# Patient Record
Sex: Female | Born: 1995 | Race: White | Hispanic: No | Marital: Single | Smoking: Never smoker
Health system: Southern US, Community
[De-identification: ages and names within clinical notes are randomized; demographics above are authoritative.]

---
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
## 2017-03-02 ENCOUNTER — Other Ambulatory Visit: Payer: Self-pay | Admitting: Sports Medicine

## 2017-04-09 ENCOUNTER — Ambulatory Visit (INDEPENDENT_AMBULATORY_CARE_PROVIDER_SITE_OTHER): Payer: 59 | Admitting: Sports Medicine

## 2017-04-09 ENCOUNTER — Encounter: Payer: Self-pay | Admitting: Sports Medicine

## 2017-04-09 DIAGNOSIS — G43109 Migraine with aura, not intractable, without status migrainosus: Secondary | ICD-10-CM | POA: Diagnosis not present

## 2017-04-09 DIAGNOSIS — Z0000 Encounter for general adult medical examination without abnormal findings: Secondary | ICD-10-CM

## 2017-04-09 DIAGNOSIS — F329 Major depressive disorder, single episode, unspecified: Secondary | ICD-10-CM

## 2017-04-09 MED ORDER — RIZATRIPTAN BENZOATE 10 MG PO TABS: ORAL_TABLET | ORAL | 11 refills | Status: DC

## 2017-04-09 NOTE — Progress Notes (Signed)
  Subjective:    CC: Annual physical   HPI:  Patricia Best is here for a physical before she goes to [employer] in [place].  She is doing well, has no complaints, she will be studying music.  Anxiety and depression: Patient is self discontinued all of her medications, overall she feels good.  Migraine headaches: Has stopped her Topamax, has only had a seen a migraine that lasted about 4 hours, agreeable to keep some of the abortive medications with her.  Past medical history:  Negative.  See flowsheet/record as well for more information.  Surgical history: Negative.  See flowsheet/record as well for more information.  Family history: Negative.  See flowsheet/record as well for more information.  Social history: Negative.  See flowsheet/record as well for more information.  Allergies, and medications have been entered into the medical record, reviewed, and no changes needed.    Review of Systems: No headache, visual changes, nausea, vomiting, diarrhea, constipation, dizziness, abdominal pain, skin rash, fevers, chills, night sweats, swollen lymph nodes, weight loss, chest pain, body aches, joint swelling, muscle aches, shortness of breath, mood changes, visual or auditory hallucinations.  Objective:    General: Well Developed, well nourished, and in no acute distress.  Neuro: Alert and oriented x3, extra-ocular muscles intact, sensation grossly intact. Cranial nerves II through XII are intact, motor, sensory, and coordinative functions are all intact. HEENT: Normocephalic, atraumatic, pupils equal round reactive to light, neck supple, no masses, no lymphadenopathy, thyroid nonpalpable. Oropharynx, nasopharynx, external ear canals are unremarkable. Skin: Warm and dry, no rashes noted.  Cardiac: Regular rate and rhythm, no murmurs rubs or gallops.  Respiratory: Clear to auscultation bilaterally. Not using accessory muscles, speaking in full sentences.  Abdominal: Soft, nontender,  nondistended, positive bowel sounds, no masses, no organomegaly.  Musculoskeletal: Shoulder, elbow, wrist, hip, knee, ankle stable, and with full range of motion.  Impression and Recommendations:    The patient was counselled, risk factors were discussed, anticipatory guidance given.  Annual physical exam Annual physical as above, former filled out for school. She is up-to-date on vaccinations. Checking routine blood work.  Depression Patient has weaned herself off of all medications and continues to do okay. Certainly if she starts to develop some depressive symptoms in college they have psychologists there and I would be happy to restart Trintellix.  Migraine headache with aura Refilling Maxalt, no longer taking preventative medications.

## 2017-04-09 NOTE — Assessment & Plan Note (Signed)
Annual physical as above, former filled out for school. She is up-to-date on vaccinations. Checking routine blood work.

## 2017-04-09 NOTE — Assessment & Plan Note (Signed)
Refilling Maxalt, no longer taking preventative medications.

## 2017-04-09 NOTE — Assessment & Plan Note (Addendum)
Patient has weaned herself off of all medications and continues to do okay. Certainly if she starts to develop some depressive symptoms in college they have psychologists there and I would be happy to restart Trintellix.

## 2017-04-10 LAB — VITAMIN D 25 HYDROXY (VIT D DEFICIENCY, FRACTURES): Vit D, 25-Hydroxy: 26 ng/mL — ABNORMAL LOW (ref 30–100)

## 2017-04-10 LAB — COMPREHENSIVE METABOLIC PANEL WITH GFR
Albumin: 4.4 g/dL (ref 3.6–5.1)
Alkaline Phosphatase: 67 U/L (ref 47–176)
BUN: 12 mg/dL (ref 7–20)
Creat: 0.85 mg/dL (ref 0.50–1.00)
Sodium: 140 mmol/L (ref 135–146)
Total Protein: 7.3 g/dL (ref 6.3–8.2)

## 2017-04-10 LAB — COMPREHENSIVE METABOLIC PANEL
ALT: 10 U/L (ref 5–32)
AST: 17 U/L (ref 12–32)
CO2: 27 mmol/L (ref 20–31)
Calcium: 9.4 mg/dL (ref 8.9–10.4)
Chloride: 102 mmol/L (ref 98–110)
Glucose, Bld: 74 mg/dL (ref 65–99)
Potassium: 4.4 mmol/L (ref 3.8–5.1)
Total Bilirubin: 0.3 mg/dL (ref 0.2–1.1)

## 2017-04-10 LAB — LIPID PANEL W/REFLEX DIRECT LDL
Cholesterol: 164 mg/dL (ref <170)
HDL: 51 mg/dL (ref >45)
LDL-Cholesterol: 88 mg/dL (ref <110)
Non-HDL Cholesterol (Calc): 113 mg/dL (ref <120)
Total CHOL/HDL Ratio: 3.2 Ratio (ref <5.0)
Triglycerides: 153 mg/dL — ABNORMAL HIGH (ref <90)

## 2017-04-10 LAB — HEMOGLOBIN A1C
Hgb A1c MFr Bld: 5.3 % (ref <5.7)
Mean Plasma Glucose: 105 mg/dL

## 2017-04-10 LAB — CBC
HCT: 39.1 % (ref 34.0–46.0)
Hemoglobin: 12.5 g/dL (ref 11.5–15.3)
MCH: 26.5 pg (ref 25.0–35.0)
MCHC: 32 g/dL (ref 31.0–36.0)
MCV: 83 fL (ref 78.0–98.0)
MPV: 9.3 fL (ref 7.5–12.5)
Platelets: 338 K/uL (ref 140–400)
RBC: 4.71 MIL/uL (ref 3.80–5.10)
RDW: 14.7 % (ref 11.0–15.0)
WBC: 7.5 10*3/uL (ref 4.5–13.0)

## 2017-04-10 LAB — TSH: TSH: 1.45 mIU/L (ref 0.50–4.30)

## 2017-04-10 MED ORDER — VITAMIN D (ERGOCALCIFEROL) 1.25 MG (50000 UNIT) PO CAPS: 50000.0000 [IU] | ORAL_CAPSULE | ORAL | 0 refills | Status: DC

## 2017-04-10 NOTE — Addendum Note (Signed)
Addended by: Monica BectonHEKKEKANDAM, THOMAS J on: 04/10/2017 08:51 AM   Modules accepted: Orders

## 2017-04-11 LAB — QUANTIFERON TB GOLD ASSAY (BLOOD)
Interferon Gamma Release Assay: NEGATIVE
Mitogen-Nil: 9.36 IU/mL
Quantiferon Nil Value: 0.04 [IU]/mL
Quantiferon Tb Ag Minus Nil Value: 0.01 IU/mL

## 2017-04-30 ENCOUNTER — Encounter: Payer: Self-pay | Admitting: Sports Medicine

## 2017-06-04 ENCOUNTER — Ambulatory Visit: Payer: Self-pay

## 2017-06-20 ENCOUNTER — Other Ambulatory Visit: Payer: Self-pay | Admitting: Sports Medicine

## 2017-07-26 IMAGING — CR DG CHEST 2V
2 series · 2 of 2 positions shown · non-contrast
Comparison: No prior.

CLINICAL DATA: Coughing.

EXAM:
CHEST  2 VIEW

[chest pa]
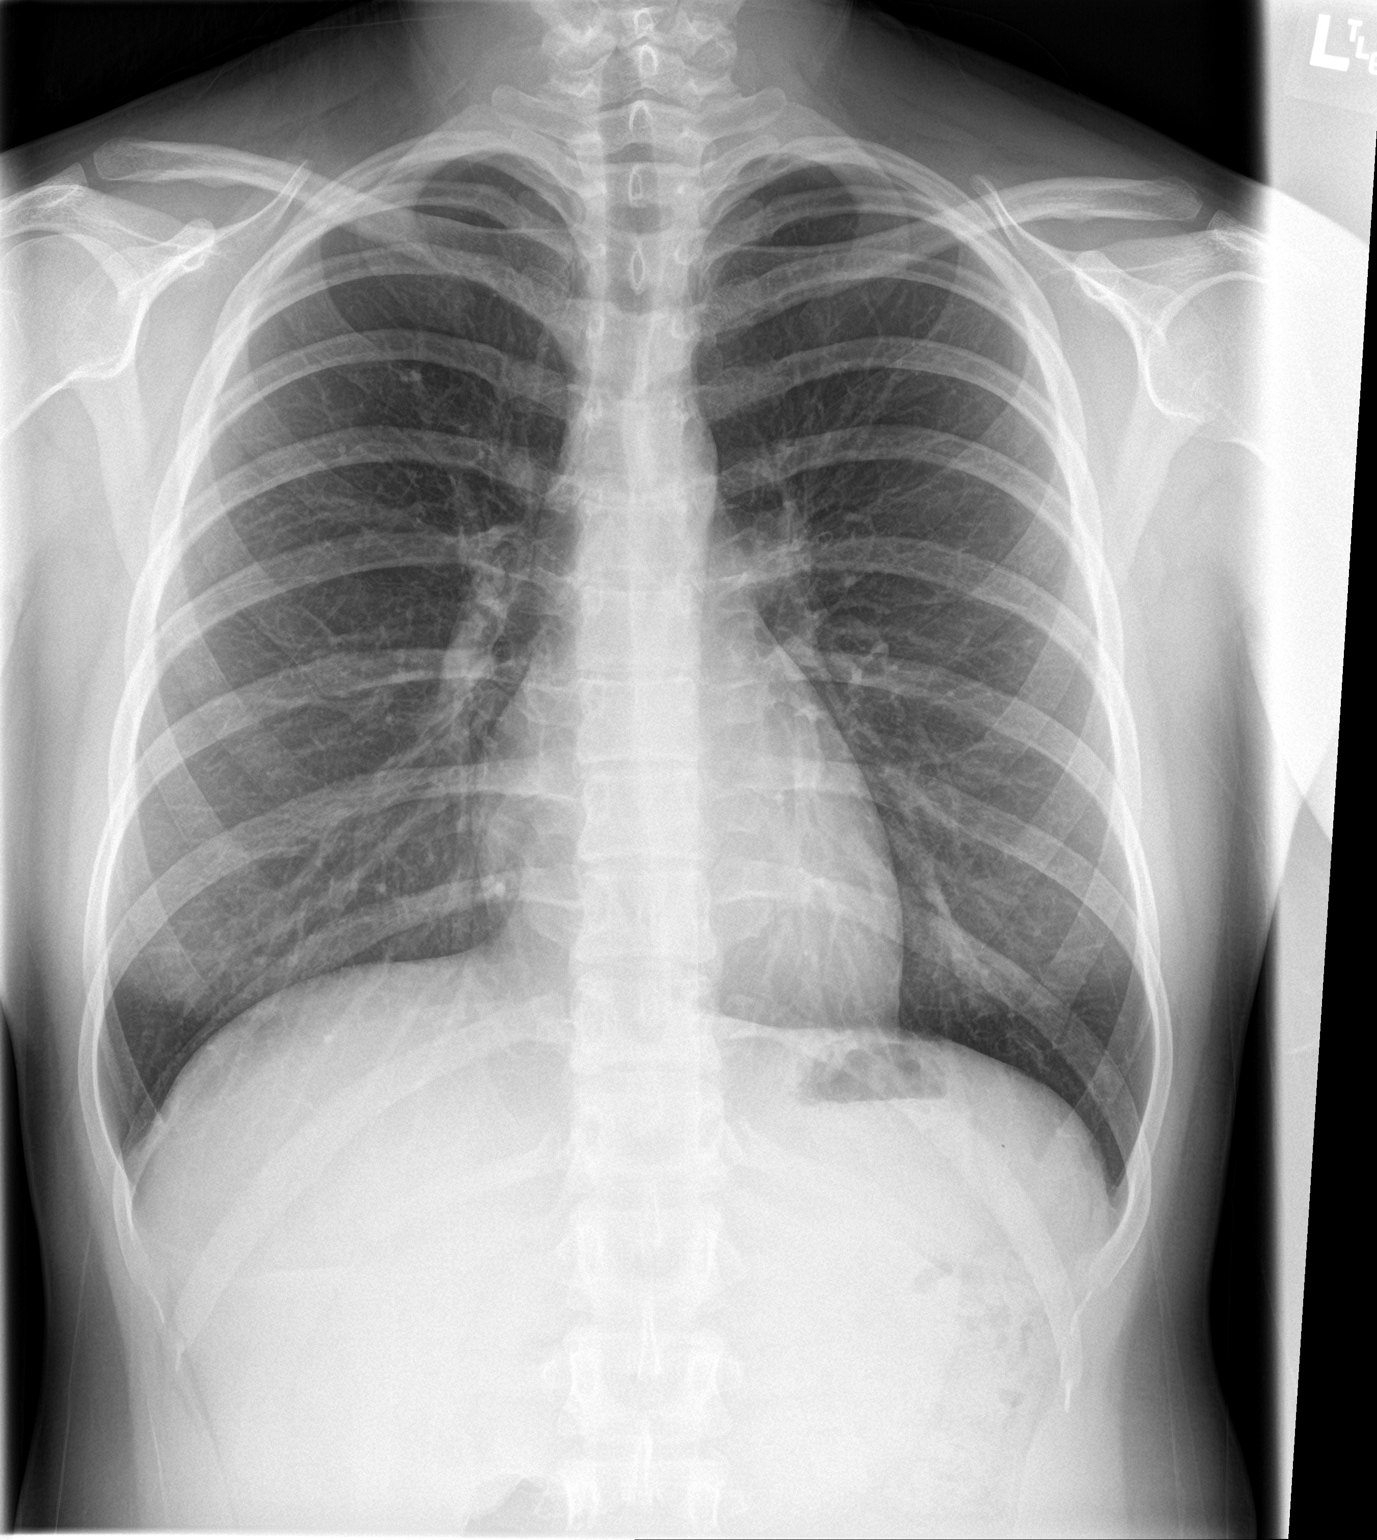

[chest lat]
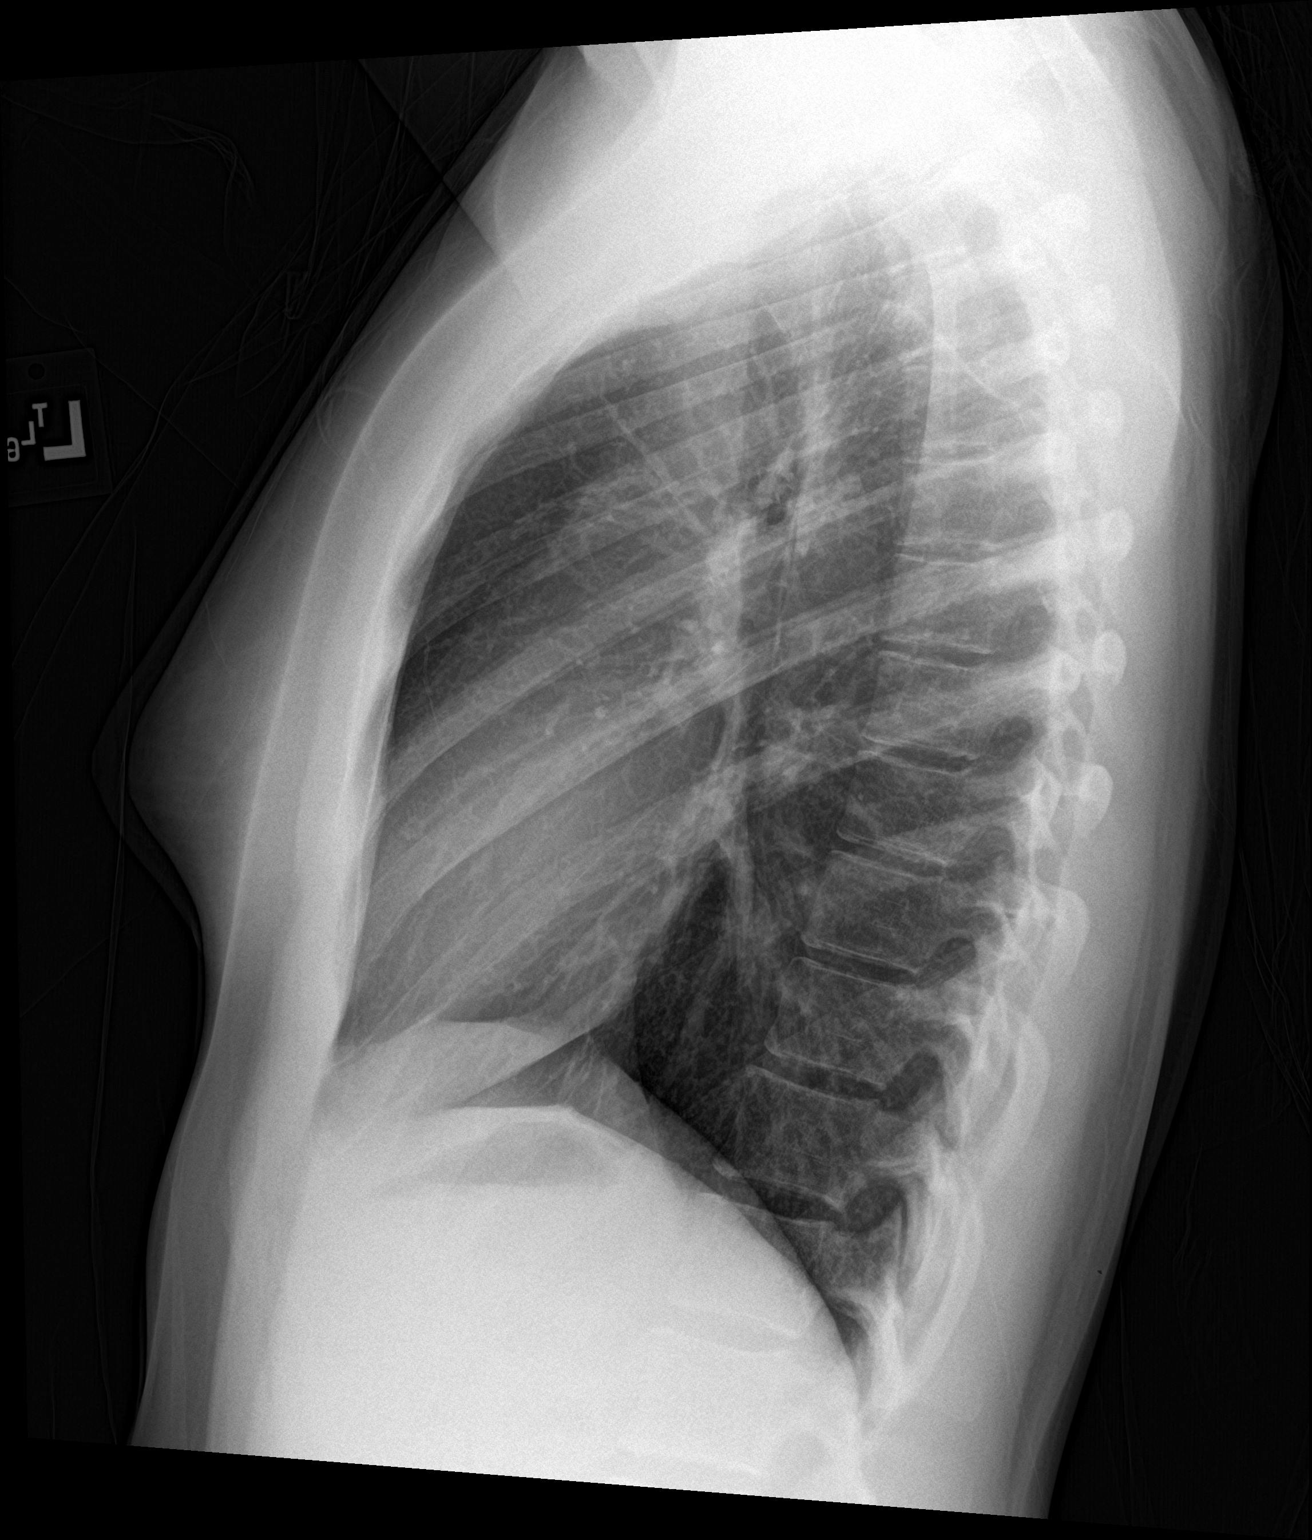

[2 of 2 positions shown; findings below may reference images not displayed]

FINDINGS: Mediastinum and hilar structures normal. Lungs are clear. Heart size
normal. No pleural effusion or pneumothorax .
IMPRESSION: No acute cardiopulmonary disease.

## 2017-09-25 ENCOUNTER — Ambulatory Visit: Payer: 59 | Admitting: Family Medicine

## 2017-09-25 ENCOUNTER — Ambulatory Visit: Payer: Self-pay | Admitting: Physician Assistant

## 2017-09-25 ENCOUNTER — Encounter: Payer: Self-pay | Admitting: Family Medicine

## 2017-09-25 VITALS — BP 137/74 | HR 83 | Temp 98.0°F | Resp 16 | Ht 62.0 in | Wt 155.1 lb

## 2017-09-25 DIAGNOSIS — L858 Other specified epidermal thickening: Secondary | ICD-10-CM | POA: Diagnosis not present

## 2017-09-25 DIAGNOSIS — L219 Seborrheic dermatitis, unspecified: Secondary | ICD-10-CM

## 2017-09-25 DIAGNOSIS — L719 Rosacea, unspecified: Secondary | ICD-10-CM

## 2017-09-25 MED ORDER — TRIAMCINOLONE ACETONIDE 0.1 % EX CREA: 1.0000 "application " | TOPICAL_CREAM | Freq: Two times a day (BID) | CUTANEOUS | 12 refills | Status: DC

## 2017-09-25 MED ORDER — KETOCONAZOLE 2 % EX CREA: 1.0000 "application " | TOPICAL_CREAM | Freq: Two times a day (BID) | CUTANEOUS | 12 refills | Status: DC

## 2017-09-25 MED ORDER — METRONIDAZOLE 1 % EX GEL: Freq: Every day | CUTANEOUS | 12 refills | Status: DC

## 2017-09-25 NOTE — Patient Instructions (Signed)
Thank you for coming in today. For eyelid rash I think this is Seborrheic Dermatitis.  Apply ketoconazole twice daily until it goes away.  You can also use Triamolone cream occasionally on it for bad days.   For Face rash I think this is rosacea.  Apply metrogel daily until it goes away.   For arm rash when bad use Triamolone cream daily or twice daily  Seborrheic Dermatitis, Adult Seborrheic dermatitis is a skin disease that causes red, scaly patches. It usually occurs on the scalp, and it is often called dandruff. The patches may appear on other parts of the body. Skin patches tend to appear where there are many oil glands in the skin. Areas of the body that are commonly affected include:  Scalp.  Skin folds of the body.  Ears.  Eyebrows.  Neck.  Face.  Armpits.  The bearded area of men's faces.  The condition may come and go for no known reason, and it is often long-lasting (chronic). What are the causes? The cause of this condition is not known. What increases the risk? This condition is more likely to develop in people who:  Have certain conditions, such as: ? HIV (human immunodeficiency virus). ? AIDS (acquired immunodeficiency syndrome). ? Parkinson disease. ? Mood disorders, such as depression.  Are 19 years old.  What are the signs or symptoms? Symptoms of this condition include:  Thick scales on the scalp.  Redness on the face or in the armpits.  Skin that is flaky. The flakes may be white or yellow.  Skin that seems oily or dry but is not helped with moisturizers.  Itching or burning in the affected areas.  How is this diagnosed? This condition is diagnosed with a medical history and physical exam. A sample of your skin may be tested (skin biopsy). You may need to see a skin specialist (dermatologist). How is this treated? There is no cure for this condition, but treatment can help to manage the symptoms. You may get treatment to remove scales,  lower the risk of skin infection, and reduce swelling or itching. Treatment may include:  Creams that reduce swelling and irritation (steroids).  Creams that reduce skin yeast.  Medicated shampoo, soaps, moisturizing creams, or ointments.  Medicated moisturizing creams or ointments.  Follow these instructions at home:  Apply over-the-counter and prescription medicines only as told by your health care provider.  Use any medicated shampoo, soaps, skin creams, or ointments only as told by your health care provider.  Keep all follow-up visits as told by your health care provider. This is important. Contact a health care provider if:  Your symptoms do not improve with treatment.  Your symptoms get worse.  You have new symptoms. This information is not intended to replace advice given to you by your health care provider. Make sure you discuss any questions you have with your health care provider. Document Released: 10/22/2005 Document Revised: 05/11/2016 Document Reviewed: 02/09/2016 Elsevier Interactive Patient Education  2018 [employer]    Rosacea Rosacea is a long-term (chronic) condition that affects the skin of the face, including the cheeks, nose, brow, and chin. This condition can also affect the eyes. Rosacea causes blood vessels near the surface of the skin to enlarge, which results in redness. What are the causes? The cause of this condition is not known. Certain triggers can make rosacea worse, including:  Hot baths.  Exercise.  Sunlight.  Very hot or cold temperatures.  Hot or spicy foods and drinks.  Drinking alcohol.  Stress.  Taking blood pressure medicine.  Long-term use of topical steroids on the face.  What increases the risk? This condition is more likely to develop in:  People who are older than 19 years of age.  Women.  People who have light-colored skin (light complexion).  People who have a family history of rosacea.  What are the  signs or symptoms? Symptoms of this condition include:  Redness of the face.  Red bumps or pimples on the face.  A red, enlarged nose.  Blushing easily.  Red lines on the skin.  Irritated or burning feeling in the eyes.  Swollen eyelids.  How is this diagnosed? This condition is diagnosed with a medical history and physical exam. How is this treated? There is no cure for this condition, but treatment can help to control your symptoms. Your health care provider may recommend that you see a skin specialist (dermatologist). Treatment may include:  Antibiotic medicines that are applied to the skin or taken as a pill.  Laser treatment to improve the appearance of the skin.  Surgery. This is rare.  Your health care provider will also recommend the best way to take care of your skin. Even after your skin improves, you will likely need to continue treatment to prevent your rosacea from coming back. Follow these instructions at home: Skin Care Take care of your skin as told by your health care provider. You may be told to do these things:  Wash your skin gently two or more times each day.  Use mild soap.  Use a sunscreen or sunblock with SPF 30 or greater.  Use gentle cosmetics that are meant for sensitive skin.  Shave with an electric shaver instead of a blade.  Lifestyle  Try to keep track of what foods trigger this condition. Avoid any triggers. These may include: ? Spicy foods. ? Seafood. ? Cheese. ? Hot liquids. ? Nuts. ? Chocolate. ? Iodized salt.  Do not drink alcohol.  Avoid extremely cold or hot temperatures.  Try to reduce your stress. If you need help, talk with your health care provider.  When you exercise, do these things to stay cool: ? Limit your sun exposure. ? Use a fan. ? Do shorter and more frequent intervals of exercise. General instructions  Keep all follow-up visits as told by your health care provider. This is important.  Take  over-the-counter and prescription medicines only as told by your health care provider.  If your eyelids are affected, apply warm compresses to them. Do this as told by your health care provider.  If you were prescribed an antibiotic medicine, apply or take it as told by your health care provider. Do not stop using the antibiotic even if your condition improves. Contact a health care provider if:  Your symptoms get worse.  Your symptoms do not improve after two months of treatment.  You have new symptoms.  You have any changes in vision or you have problems with your eyes, such as redness or itching.  You feel depressed.  You lose your appetite.  You have trouble concentrating. This information is not intended to replace advice given to you by your health care provider. Make sure you discuss any questions you have with your health care provider. Document Released: 11/29/2004 Document Revised: 03/29/2016 Document Reviewed: 12/29/2014 Elsevier Interactive Patient Education  2018 Elsevier Inc.   Keratosis Pilaris, Pediatric Keratosis pilaris is a long-term (chronic) condition that causes tiny, painless skin bumps. The bumps  result when dead skin builds up in the roots of skin hairs (hair follicles). This condition is common among children. It does not spread from person to person (is not contagious) and it does not cause any serious medical problems. The condition usually develops by age 41 and often starts to go away during teenage or young adult years. In other cases, keratosis pilaris may be more likely to flare up during puberty. What are the causes? The exact cause of this condition is not known. It may be passed along from parent to child (inherited). What increases the risk? Your child may have a greater risk of keratosis pilaris if your child:  Has a family history of the condition.  Is a girl.  Swims often in swimming pools.  Has eczema, asthma, or hay fever.  What are  the signs or symptoms? The main symptom of keratosis pilaris is tiny bumps on the skin. The bumps may:  Feel itchy or rough.  Look like goose bumps.  Be the same color as the skin, white, pink, red, or darker than normal skin color.  Come and go.  Get worse during winter.  Cover a small or large area.  Develop on the arms, thighs, and cheeks. They may also appear on other areas of skin. They do not appear on the palms of the hands or soles of the feet.  How is this diagnosed? This condition is diagnosed based on your child's symptoms and medical history and a physical exam. No tests are needed to make a diagnosis. How is this treated? There is no cure for keratosis pilaris. The condition may go away over time. Your child may not need treatment unless the bumps are itchy or widespread or they become infected from scratching. Treatment may include:  Moisturizing cream or lotion.  Skin-softening cream (emollient).  A cream or ointment that reduces inflammation (steroid).  Antibiotic medicine, if a skin infection develops. The antibiotic may be given by mouth (orally) or as a cream.  Follow these instructions at home: Skin Care  Apply skin cream or ointment as told by your child's health care provider. Do not stop using the cream or ointment even if your child's condition improves.  Do not let your child take long, hot, baths or showers. Apply moisturizing creams and lotions after a bath or shower.  Do not use soaps that dry your child's skin. Ask your child's health care provider to recommend a mild soap.  Do not let your child swim in swimming pools if it makes your child's skin condition worse.  Remind your child not to scratch or pick at skin bumps. Tell your child's health care provider if itching is a problem. General instructions   Give your child antibiotic medicine as told by your child's health care provider. Do not stop applying or giving the antibiotic even if  your child's condition improves.  Give your child over-the-counter and prescription medicines only as told by your child's health care provider.  Use a humidifier if the air in your home is dry.  Have your child return to normal activities as told by your child's health care provider. Ask what activities are safe for your child.  Keep all follow-up visits as told by your child's health care provider. This is important. Contact a health care provider if:  Your child's condition gets worse.  Your child has itchiness or scratches his or her skin.  Your child's skin becomes: ? Red. ? Unusually warm. ? Painful. ?  Swollen. This information is not intended to replace advice given to you by your health care provider. Make sure you discuss any questions you have with your health care provider. Document Released: 11/06/2015 Document Revised: 05/11/2016 Document Reviewed: 11/06/2015 Elsevier Interactive Patient Education  2018 [employer]

## 2017-09-25 NOTE — Progress Notes (Signed)
Patricia CatchingsCarson Best is a 21 y.o. female who [facility] today for rash.   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  She has several rashes he would like to discuss.  1) rash on eyelids.  [name] has a scaly irritated rash on her upper eyelids bilaterally.  This is been ongoing for about a month it is mildly itchy.  She has not tried much treatment yet denies any fevers or chills new soaps detergents cosmetics or makeups or medications and feels well otherwise.  2) rash on face: [name] has a red macular rash on her malar area of her face present for years.  Her sister and father had a similar rash and suspect rosacea.  She has not tried any treatment for this yet.  She notes it tends to get worse in the sun and winter.  She denies muscle aches or pains.  3) rash on arm: [name] has itchy red bumps on her arms that get worse in the winter.  Her father and sister have this and have been told it is keratosis pilaris.  She notes her left forearm is mildly irritated and has been worse over the past several weeks.  She has not tried any treatment yet.  History reviewed. No pertinent past medical history. History reviewed. No pertinent surgical history. Social History   Tobacco Use  . Smoking status: Never Smoker  . Smokeless tobacco: Never Used  Substance Use Topics  . Alcohol use: Not on file   family history is not on file.  ROS as above:  Medications: Current Outpatient Medications  Medication Sig Dispense Refill  . ketoconazole (NIZORAL) 2 % cream Apply 1 application topically 2 (two) times daily. To rash on eyelids 60 g 12  . metroNIDAZOLE (METROGEL) 1 % gel Apply topically daily. To rosacea on face 60 g 12  . triamcinolone cream (KENALOG) 0.1 % Apply 1 application topically 2 (two) times daily. To rash on arms as needed 453.6 g 12     No current facility-administered medications for this visit.    No Known Allergies  Health Maintenance Health Maintenance  Topic Date Due  . HIV Screening  10/10/2013  . INFLUENZA VACCINE  06/05/2017     Exam:  BP 137/74   Pulse 83   Temp 98 F (36.7 C) (Oral)   Resp 16   Ht 5\' 2"  (1.575 m)   Wt 155 lb 1.3 oz (70.3 kg)   SpO2 100%   BMI 28.36 kg/m  Gen: Well NAD Skin: Scaly mildly erythematous rash bilateral upper eyelids consistent with seborrheic dermatitis. Malar macular rash consistent with rosacea present Erythematous small papules on left forearm consistent with irritated keratosis pilaris present     No results found for this or any previous visit (from the past 72 hour(s)). No results found.    Assessment and Plan: 21 y.o. female with  Seborrheic dermatitis treat with ketoconazole cream recheck as needed.  Rosacea on face is very likely the malar rash.  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  Recheck as needed.   No orders of the defined types were placed in this encounter.  Meds ordered this encounter  Medications  . ketoconazole (NIZORAL) 2 % cream    Sig: Apply 1 application topically 2 (two) times daily. To rash on eyelids  Dispense:  60 g    Refill:  12  . triamcinolone cream (KENALOG) 0.1 %    Sig: Apply 1 application topically 2 (two) times daily. To rash on arms as needed    Dispense:  453.6 g    Refill:  12  . metroNIDAZOLE (METROGEL) 1 % gel    Sig: Apply topically daily. To rosacea on face    Dispense:  60 g    Refill:  12     Discussed warning signs or symptoms. Please see discharge instructions. Patient expresses understanding.  I spent 25 minutes with this patient, greater than 50% was face-to-face time counseling regarding ddx and treatment plan.

## 2017-12-08 IMAGING — CR DG CHEST 2V
2 series · 2 of 2 positions shown · non-contrast
Comparison: Chest x-ray of 09/12/2017

CLINICAL DATA: Cough, congestion, fever for 5 days

EXAM:
CHEST  2 VIEW

[chest pa]
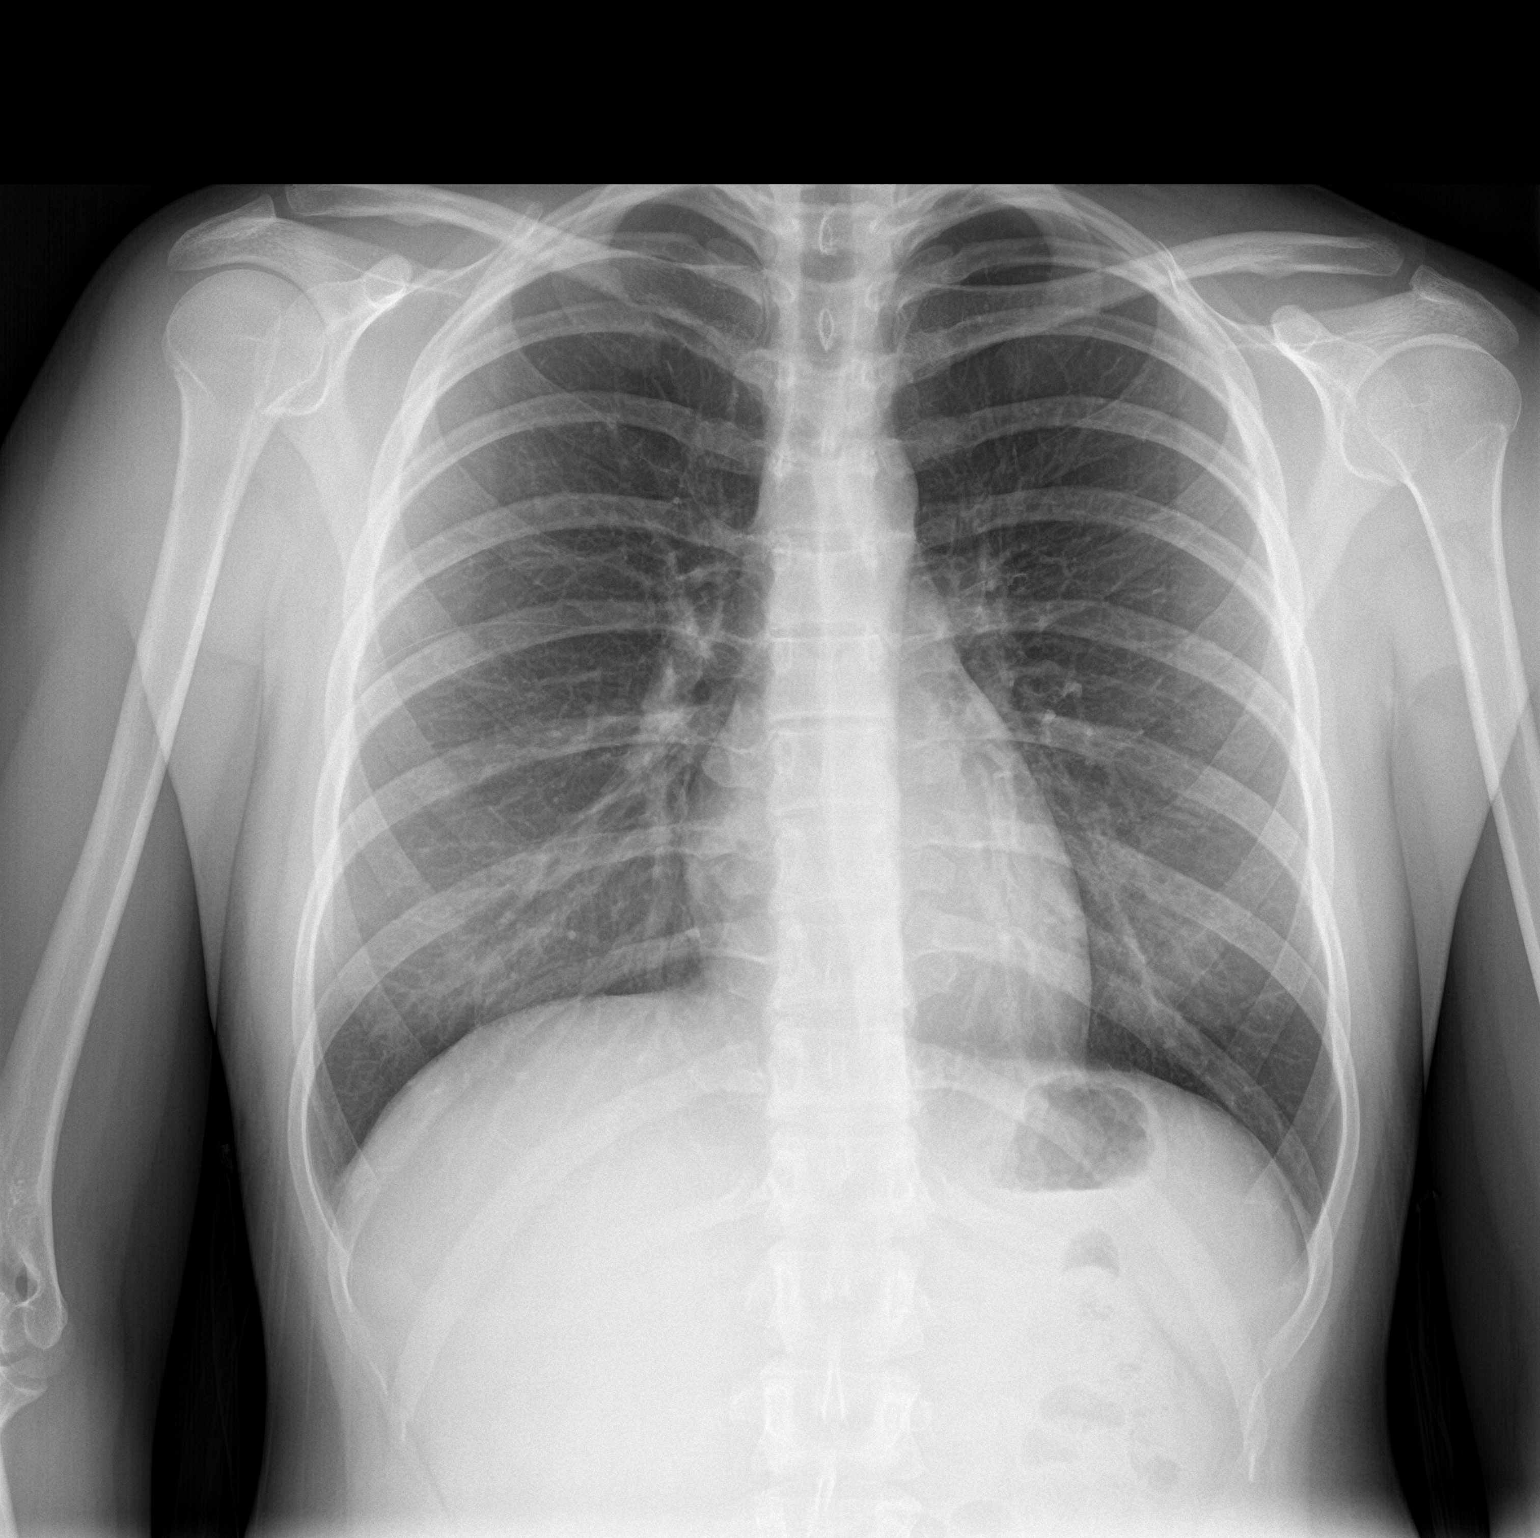

[chest lat]
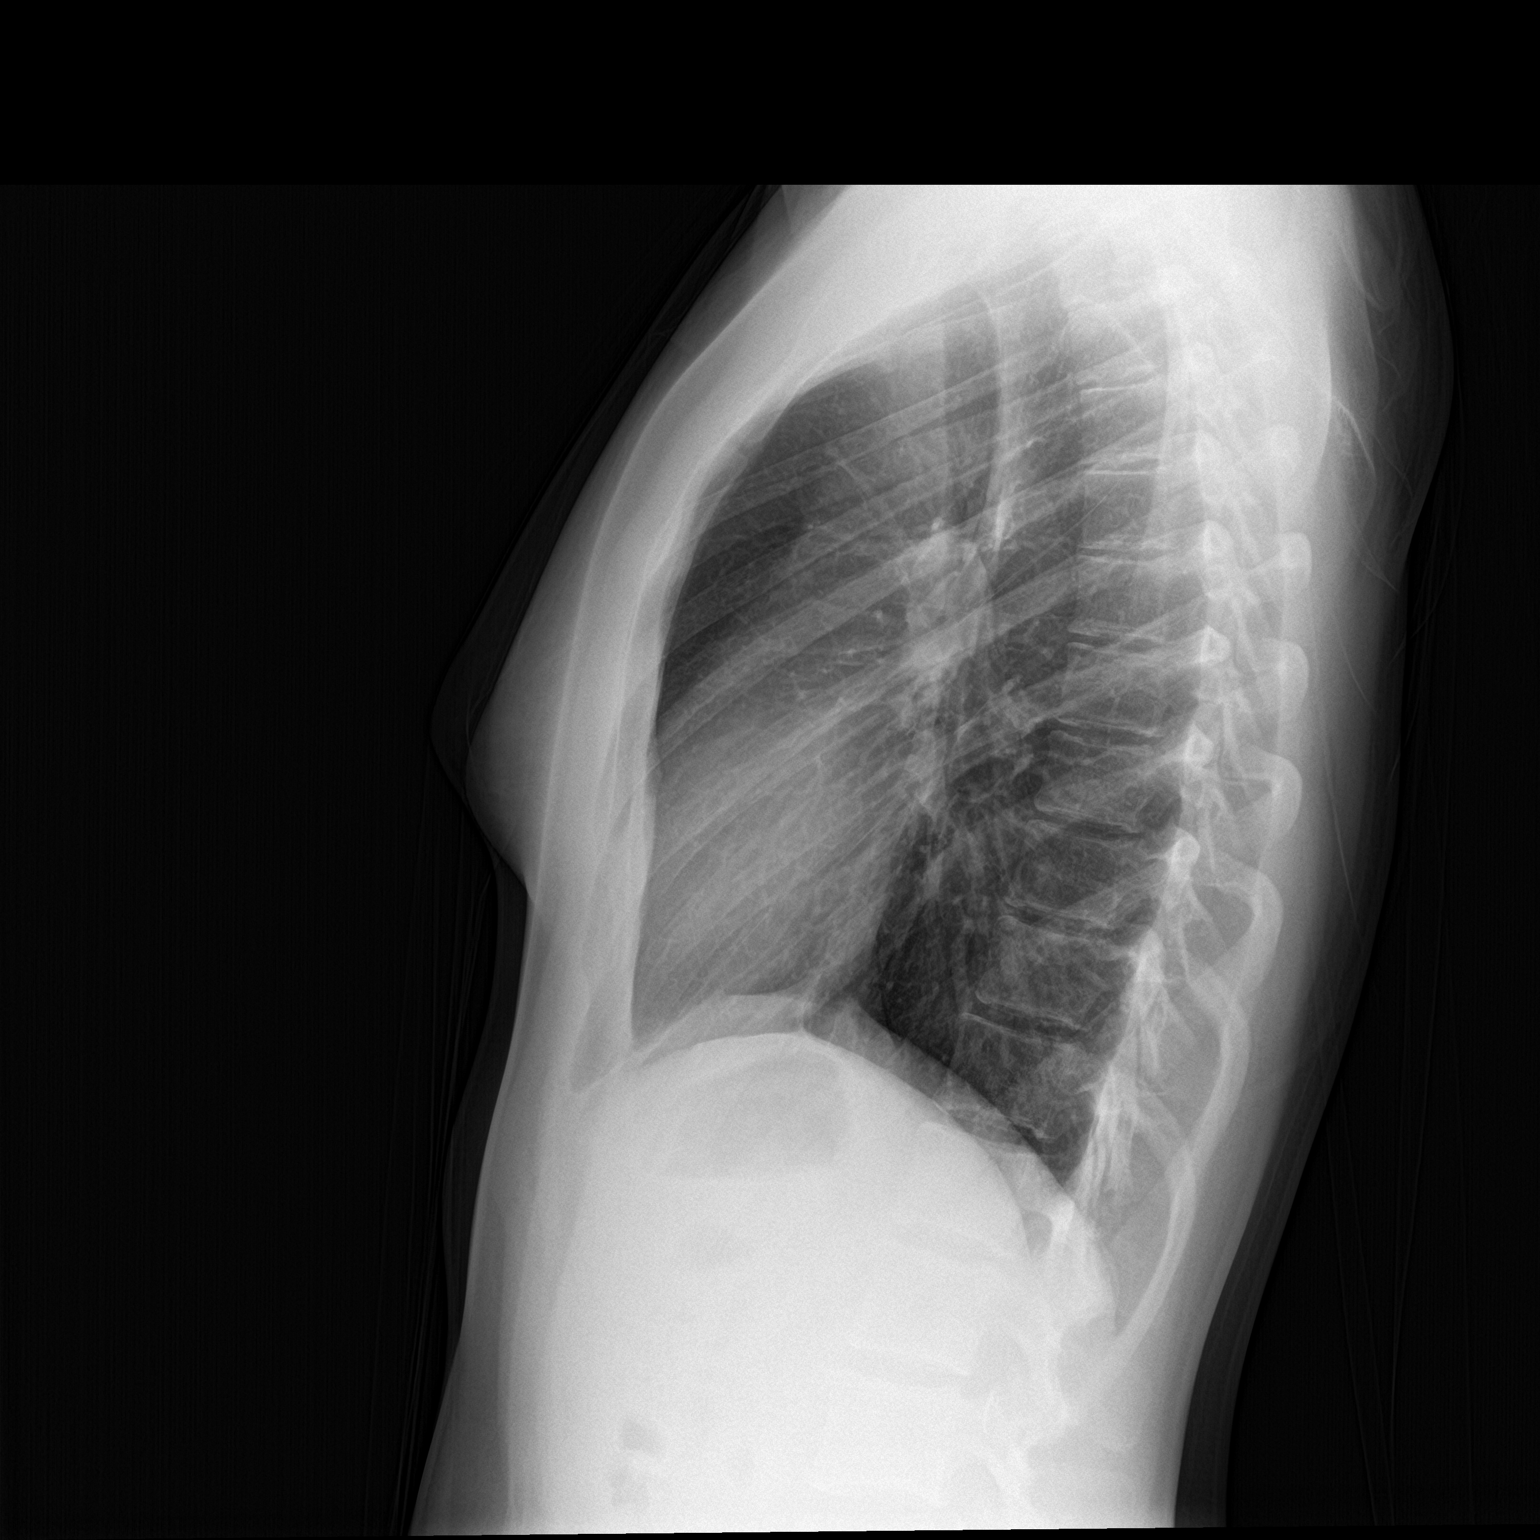

[2 of 2 positions shown; findings below may reference images not displayed]

FINDINGS: No active infiltrate or effusion is seen. Mediastinal and hilar
contours are unremarkable. The heart is within normal limits in
size. No bony abnormality is seen.
IMPRESSION: No active cardiopulmonary disease.

## 2018-06-27 ENCOUNTER — Other Ambulatory Visit: Payer: Self-pay | Admitting: Sports Medicine

## 2018-06-27 DIAGNOSIS — G43109 Migraine with aura, not intractable, without status migrainosus: Secondary | ICD-10-CM

## 2018-06-27 MED ORDER — RIZATRIPTAN BENZOATE 10 MG PO TABS: ORAL_TABLET | ORAL | 0 refills | Status: DC

## 2018-08-28 ENCOUNTER — Other Ambulatory Visit: Payer: Self-pay | Admitting: Sports Medicine

## 2018-08-28 DIAGNOSIS — G43109 Migraine with aura, not intractable, without status migrainosus: Secondary | ICD-10-CM

## 2018-09-02 ENCOUNTER — Other Ambulatory Visit: Payer: Self-pay | Admitting: Sports Medicine

## 2018-09-02 DIAGNOSIS — G43109 Migraine with aura, not intractable, without status migrainosus: Secondary | ICD-10-CM

## 2018-10-01 ENCOUNTER — Encounter: Payer: Self-pay | Admitting: Sports Medicine

## 2018-10-01 ENCOUNTER — Ambulatory Visit (INDEPENDENT_AMBULATORY_CARE_PROVIDER_SITE_OTHER): Payer: 59 | Admitting: Sports Medicine

## 2018-10-01 VITALS — BP 121/85 | HR 83 | Temp 98.0°F | Ht 61.0 in | Wt 156.0 lb

## 2018-10-01 DIAGNOSIS — Z0000 Encounter for general adult medical examination without abnormal findings: Secondary | ICD-10-CM | POA: Diagnosis not present

## 2018-10-01 DIAGNOSIS — F329 Major depressive disorder, single episode, unspecified: Secondary | ICD-10-CM | POA: Diagnosis not present

## 2018-10-01 DIAGNOSIS — R110 Nausea: Secondary | ICD-10-CM

## 2018-10-01 DIAGNOSIS — Z23 Encounter for immunization: Secondary | ICD-10-CM

## 2018-10-01 DIAGNOSIS — G43109 Migraine with aura, not intractable, without status migrainosus: Secondary | ICD-10-CM

## 2018-10-01 MED ORDER — RIZATRIPTAN BENZOATE 10 MG PO TABS: ORAL_TABLET | ORAL | 11 refills | Status: DC

## 2018-10-01 MED ORDER — TOPIRAMATE 50 MG PO TABS: ORAL_TABLET | ORAL | 11 refills | Status: DC

## 2018-10-01 NOTE — Assessment & Plan Note (Signed)
A year ago the patient weaned herself off of all her medications and continues to do well. She is currently a sophomore in college, she is successful. We can always restart Trintellix should she desire.

## 2018-10-01 NOTE — Progress Notes (Signed)
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  Overall doing well, she is happy.  Migraines: We discontinued Topamax previously, she has now run out of Maxalt, and is starting to have more headaches, approximately 10 headache days per month that do sound clinically to be migraines.  She is agreeable to restart preventative treatment.  Nausea after eating: Random, she has not yet been able to isolate a specific food, no overt abdominal pain with the symptoms.  No yellowing of eyes, no vomiting, diarrhea, constipation.  I reviewed the past medical history, family history, social history, surgical history, and allergies today and no changes were needed.  Please see the problem list section below in epic for further details.  Past Medical History: No past medical history on file. Past Surgical History: No past surgical history on file. Social History: Social History   Socioeconomic History  . Marital status: Single    Spouse name: Not on file  . Number of children: Not on file  . Years of education: Not on file  . Highest education level: Not on file  Occupational History  . Not on file  Social Needs  . Financial resource strain: Not on file  . Food insecurity:    Worry: Not on file    Inability: Not on file  . Transportation needs:    Medical: Not on file    Non-medical: Not on file  Tobacco Use  . Smoking status: Never Smoker  . Smokeless tobacco: Never Used  Substance and Sexual Activity  . Alcohol use: Not on file  . Drug use: Not on file  . Sexual activity: Not on file  Lifestyle  . Physical activity:    Days per week: Not on file    Minutes per session: Not on file  . Stress: Not on file  Relationships  . Social connections:    Talks on phone: Not on file    Gets together: Not on file    Attends religious service: Not on file    Active  member of club or organization: Not on file    Attends meetings of clubs or organizations: Not on file    Relationship status: Not on file  Other Topics Concern  . Not on file  Social History Narrative  . Not on file   Family History: No family history on file. Allergies: No Known Allergies Medications: See med rec.  Review of Systems: No headache, visual changes, nausea, vomiting, diarrhea, constipation, dizziness, abdominal pain, skin rash, fevers, chills, night sweats, swollen lymph nodes, weight loss, chest pain, body aches, joint swelling, muscle aches, shortness of breath, mood changes, visual or auditory hallucinations.  Objective:    General: Well Developed, well nourished, and in no acute distress.  Neuro: Alert and oriented x3, extra-ocular muscles intact, sensation grossly intact. Cranial nerves II through XII are intact, motor, sensory, and coordinative functions are all intact. HEENT: Normocephalic, atraumatic, pupils equal round reactive to light, neck supple, no masses, no lymphadenopathy, thyroid nonpalpable. Oropharynx, nasopharynx, external ear canals are unremarkable. Skin: Warm and dry, no rashes noted.  Cardiac: Regular rate and rhythm, no murmurs rubs or gallops.  Respiratory: Clear to auscultation bilaterally. Not using accessory muscles, speaking in full sentences.  Abdominal: Soft, nontender, nondistended, positive bowel sounds, no masses, no organomegaly.  Musculoskeletal: Shoulder, elbow, wrist, hip, knee, ankle stable, and with  full range of motion.  Impression and Recommendations:    The patient was counselled, risk factors were discussed, anticipatory guidance given.  Annual physical exam Unremarkable annual physical. Flu shot today.  Depression A year ago the patient weaned herself off of all her medications and continues to do well. She is currently a sophomore in college, she is successful. We can always restart Trintellix should she  desire.  Migraine headache with aura Having approximately 10 headache days per month. She is out of Maxalt. Adding Topamax for preventive purposes. Keep a migraine diary. Refilling Maxalt.  Postprandial nausea Unclear etiology, checking routine labs. She will try to isolate which foods causes the most, checking some labs.  ___________________________________________ Ihor Austinhomas J. Benjamin Stainhekkekandam, M.D., ABFM., CAQSM. Primary Care and Sports Medicine [facility]  Adjunct Professor of Family Medicine  [employer]

## 2018-10-01 NOTE — Assessment & Plan Note (Signed)
Unclear etiology, checking routine labs. She will try to isolate which foods causes the most, checking some labs.

## 2018-10-01 NOTE — Assessment & Plan Note (Signed)
Unremarkable annual physical. Flu shot today.

## 2018-10-01 NOTE — Assessment & Plan Note (Signed)
Having approximately 10 headache days per month. She is out of Maxalt. Adding Topamax for preventive purposes. Keep a migraine diary. Refilling Maxalt.

## 2018-10-02 LAB — AMYLASE: Amylase: 19 U/L — ABNORMAL LOW (ref 21–101)

## 2018-10-02 LAB — COMPREHENSIVE METABOLIC PANEL WITH GFR
AG Ratio: 1.7 (calc) (ref 1.0–2.5)
AST: 16 U/L (ref 12–32)
Albumin: 4.5 g/dL (ref 3.6–5.1)
CO2: 27 mmol/L (ref 20–32)
Chloride: 105 mmol/L (ref 98–110)
Globulin: 2.7 g/dL (ref 2.0–3.8)
Potassium: 4.5 mmol/L (ref 3.8–5.1)
Sodium: 139 mmol/L (ref 135–146)
Total Protein: 7.2 g/dL (ref 6.3–8.2)

## 2018-10-02 LAB — CBC
HCT: 36.2 % (ref 35.0–45.0)
Hemoglobin: 12 g/dL (ref 11.7–15.5)
MCH: 25.8 pg — ABNORMAL LOW (ref 27.0–33.0)
MCHC: 33.1 g/dL (ref 32.0–36.0)
MCV: 77.7 fL — ABNORMAL LOW (ref 80.0–100.0)
MPV: 9.8 fL (ref 7.5–12.5)
Platelets: 355 10*3/uL (ref 140–400)
RBC: 4.66 10*6/uL (ref 3.80–5.10)
RDW: 13.9 % (ref 11.0–15.0)
WBC: 5.9 Thousand/uL (ref 3.8–10.8)

## 2018-10-02 LAB — COMPREHENSIVE METABOLIC PANEL
ALT: 8 U/L (ref 5–32)
Alkaline phosphatase (APISO): 59 U/L (ref 47–176)
BUN: 12 mg/dL (ref 7–20)
Calcium: 9.6 mg/dL (ref 8.9–10.4)
Creat: 0.75 mg/dL (ref 0.50–1.00)
Glucose, Bld: 85 mg/dL (ref 65–99)
Total Bilirubin: 0.4 mg/dL (ref 0.2–1.1)

## 2018-10-02 LAB — TSH: TSH: 2.34 mIU/L

## 2018-10-02 LAB — LIPID PANEL W/REFLEX DIRECT LDL
Cholesterol: 191 mg/dL — ABNORMAL HIGH (ref <170)
HDL: 44 mg/dL — ABNORMAL LOW (ref >45)
LDL Cholesterol (Calc): 122 mg/dL — ABNORMAL HIGH (ref <110)
Non-HDL Cholesterol (Calc): 147 mg/dL — ABNORMAL HIGH (ref <120)
Total CHOL/HDL Ratio: 4.3 (calc) (ref <5.0)
Triglycerides: 134 mg/dL — ABNORMAL HIGH (ref <90)

## 2018-10-02 LAB — LIPASE: Lipase: 13 U/L (ref 7–60)

## 2019-10-28 ENCOUNTER — Encounter: Payer: Self-pay | Admitting: Sports Medicine

## 2019-10-28 ENCOUNTER — Ambulatory Visit (INDEPENDENT_AMBULATORY_CARE_PROVIDER_SITE_OTHER): Payer: 59 | Admitting: Sports Medicine

## 2019-10-28 DIAGNOSIS — Z0000 Encounter for general adult medical examination without abnormal findings: Secondary | ICD-10-CM | POA: Diagnosis not present

## 2019-10-28 DIAGNOSIS — G43109 Migraine with aura, not intractable, without status migrainosus: Secondary | ICD-10-CM | POA: Diagnosis not present

## 2019-10-28 DIAGNOSIS — F329 Major depressive disorder, single episode, unspecified: Secondary | ICD-10-CM

## 2019-10-28 MED ORDER — TOPIRAMATE 50 MG PO TABS: 50.0000 mg | ORAL_TABLET | Freq: Two times a day (BID) | ORAL | 3 refills | Status: DC

## 2019-10-28 MED ORDER — RIZATRIPTAN BENZOATE 10 MG PO TABS: ORAL_TABLET | ORAL | 11 refills | Status: DC

## 2019-10-28 NOTE — Assessment & Plan Note (Signed)
Migraines are now well controlled with Topamax and Maxalt. She gets about 2/month, aborted easily with a single Maxalt, refilling medication.

## 2019-10-28 NOTE — Progress Notes (Signed)
Virtual Visit via WebEx/MyChart   I connected with  Patricia Best  on 10/28/19 via WebEx/MyChart/Doximity Video and verified that I am speaking with the correct person using two identifiers.   I discussed the limitations, risks, security and privacy concerns of performing an evaluation and management service by WebEx/MyChart/Doximity Video, including the higher likelihood of inaccurate diagnosis and treatment, and the availability of in person appointments.  We also discussed the likely need of an additional face to face encounter for complete and high quality delivery of care.  I also discussed with the patient that there may be a patient responsible charge related to this service. The patient expressed understanding and wishes to proceed.  Provider location is either at home or medical facility. Patient location is at their home, different from provider location. People involved in care of the patient during this telehealth encounter were myself, my nurse/medical assistant, and my front office/scheduling team member.  Subjective:    CC: Follow-up  HPI: Migraines: Well-controlled.  Depression: Incomplete remission now off of all medications.  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  I reviewed the past medical history, family history, social history, surgical history, and allergies today and no changes were needed.  Please see the problem list section below in epic for further details.  Past Medical History: No past medical history on file. Past Surgical History: No past surgical history on file. Social History: Social History   Socioeconomic History  . Marital status: Single    Spouse name: Not on file  . Number of children: Not on file  . Years of education: Not on file  . Highest education level: Not on file  Occupational History  . Not on file  Tobacco Use  . Smoking status: Never Smoker    . Smokeless tobacco: Never Used  Substance and Sexual Activity  . Alcohol use: Not on file  . Drug use: Not on file  . Sexual activity: Not on file  Other Topics Concern  . Not on file  Social History Narrative  . Not on file   Social Determinants of Health   Financial Resource Strain:   . Difficulty of Paying Living Expenses: Not on file  Food Insecurity:   . Worried About Programme researcher, broadcasting/film/video in the Last Year: Not on file  . Ran Out of Food in the Last Year: Not on file  Transportation Needs:   . Lack of Transportation (Medical): Not on file  . Lack of Transportation (Non-Medical): Not on file  Physical Activity:   . Days of Exercise per Week: Not on file  . Minutes of Exercise per Session: Not on file  Stress:   . Feeling of Stress : Not on file  Social Connections:   . Frequency of Communication with Friends and Family: Not on file  . Frequency of Social Gatherings with Friends and Family: Not on file  . Attends Religious Services: Not on file  . Active Member of Clubs or Organizations: Not on file  . Attends Banker Meetings: Not on file  . Marital Status: Not on file   Family History: No family history on file. Allergies: No Known Allergies Medications: See med rec.  Review of Systems: No fevers, chills, night sweats, weight loss, chest pain, or shortness of breath.   Objective:    General: Speaking full sentences, no audible heavy breathing.  Sounds alert and appropriately  interactive.  Appears well.  Face symmetric.  Extraocular movements intact.  Pupils equal and round.  No nasal flaring or accessory muscle use visualized.  No other physical exam performed due to the non-physical nature of this visit.  Impression and Recommendations:    Migraine headache with aura Migraines are now well controlled with Topamax and Maxalt. She gets about 2/month, aborted easily with a single Maxalt, refilling medication.  Depression Currently in remission and  off of all medications. I think moving out of her home and starting college has helped her mood significantly, there was always a degree of tension between her and her mother.  Annual physical exam Checking routine labs.  I discussed the above assessment and treatment plan with the patient. The patient was provided an opportunity to ask questions and all were answered. The patient agreed with the plan and demonstrated an understanding of the instructions.   The patient was advised to call back or seek an in-person evaluation if the symptoms worsen or if the condition fails to improve as anticipated.   I provided 25 minutes of non-face-to-face time during this encounter, 15 minutes of additional time was needed to gather information, review chart, records, communicate/coordinate with staff remotely, troubleshooting the multiple errors that we get every time when trying to do video calls through the electronic medical record, WebEx, and Doximity, restart the encounter multiple times due to instability of the software, as well as complete documentation.   ___________________________________________ Gwen Her. Dianah Field, M.D., ABFM., CAQSM. Primary Care and Sports Medicine [facility]  Adjunct Professor of [employer] of Medicine

## 2019-10-28 NOTE — Assessment & Plan Note (Signed)
Checking routine labs 

## 2019-10-28 NOTE — Assessment & Plan Note (Signed)
Currently in remission and off of all medications. I think moving out of her home and starting college has helped her mood significantly, there was always a degree of tension between her and her mother.

## 2020-10-30 ENCOUNTER — Other Ambulatory Visit: Payer: Self-pay | Admitting: Sports Medicine

## 2020-10-30 DIAGNOSIS — G43109 Migraine with aura, not intractable, without status migrainosus: Secondary | ICD-10-CM

## 2020-11-07 ENCOUNTER — Other Ambulatory Visit: Payer: Self-pay | Admitting: Sports Medicine

## 2020-11-07 DIAGNOSIS — G43109 Migraine with aura, not intractable, without status migrainosus: Secondary | ICD-10-CM

## 2020-11-09 ENCOUNTER — Encounter: Payer: Self-pay | Admitting: Sports Medicine

## 2020-11-09 ENCOUNTER — Other Ambulatory Visit: Payer: Self-pay

## 2020-11-09 ENCOUNTER — Ambulatory Visit (INDEPENDENT_AMBULATORY_CARE_PROVIDER_SITE_OTHER): Payer: 59 | Admitting: Sports Medicine

## 2020-11-09 VITALS — BP 123/83 | HR 99 | Ht 61.0 in | Wt 124.0 lb

## 2020-11-09 DIAGNOSIS — E559 Vitamin D deficiency, unspecified: Secondary | ICD-10-CM

## 2020-11-09 DIAGNOSIS — Z23 Encounter for immunization: Secondary | ICD-10-CM

## 2020-11-09 DIAGNOSIS — G43109 Migraine with aura, not intractable, without status migrainosus: Secondary | ICD-10-CM

## 2020-11-09 DIAGNOSIS — Z0000 Encounter for general adult medical examination without abnormal findings: Secondary | ICD-10-CM

## 2020-11-09 DIAGNOSIS — F411 Generalized anxiety disorder: Secondary | ICD-10-CM

## 2020-11-09 DIAGNOSIS — R5383 Other fatigue: Secondary | ICD-10-CM

## 2020-11-09 DIAGNOSIS — R718 Other abnormality of red blood cells: Secondary | ICD-10-CM

## 2020-11-09 LAB — COMPREHENSIVE METABOLIC PANEL
AG Ratio: 1.7 (calc) (ref 1.0–2.5)
ALT: 8 U/L (ref 6–29)
AST: 14 U/L (ref 10–30)
Albumin: 4.8 g/dL (ref 3.6–5.1)
Alkaline phosphatase (APISO): 51 U/L (ref 31–125)
BUN: 8 mg/dL (ref 7–25)
CO2: 25 mmol/L (ref 20–32)
Calcium: 9.8 mg/dL (ref 8.6–10.2)
Chloride: 109 mmol/L (ref 98–110)
Creat: 0.77 mg/dL (ref 0.50–1.10)
Globulin: 2.9 g/dL (calc) (ref 1.9–3.7)
Glucose, Bld: 85 mg/dL (ref 65–99)
Potassium: 4.7 mmol/L (ref 3.5–5.3)
Sodium: 141 mmol/L (ref 135–146)
Total Bilirubin: 0.4 mg/dL (ref 0.2–1.2)
Total Protein: 7.7 g/dL (ref 6.1–8.1)

## 2020-11-09 LAB — CBC
HCT: 38.4 % (ref 35.0–45.0)
Hemoglobin: 12.3 g/dL (ref 11.7–15.5)
MCH: 24.5 pg — ABNORMAL LOW (ref 27.0–33.0)
MCHC: 32 g/dL (ref 32.0–36.0)
MCV: 76.3 fL — ABNORMAL LOW (ref 80.0–100.0)
MPV: 9.7 fL (ref 7.5–12.5)
Platelets: 373 10*3/uL (ref 140–400)
RBC: 5.03 10*6/uL (ref 3.80–5.10)
RDW: 14.9 % (ref 11.0–15.0)
WBC: 8.6 10*3/uL (ref 3.8–10.8)

## 2020-11-09 LAB — LIPID PANEL
Cholesterol: 170 mg/dL (ref <200)
HDL: 55 mg/dL (ref >=50)
LDL Cholesterol (Calc): 99 mg/dL (calc)
Non-HDL Cholesterol (Calc): 115 mg/dL (calc) (ref <130)
Total CHOL/HDL Ratio: 3.1 (calc) (ref <5.0)
Triglycerides: 72 mg/dL (ref <150)

## 2020-11-09 LAB — TSH: TSH: 1.28 mIU/L

## 2020-11-09 LAB — VITAMIN D 25 HYDROXY (VIT D DEFICIENCY, FRACTURES): Vit D, 25-Hydroxy: 20 ng/mL — ABNORMAL LOW (ref 30–100)

## 2020-11-09 MED ORDER — VORTIOXETINE HBR 10 MG PO TABS: 10.0000 mg | ORAL_TABLET | Freq: Every day | ORAL | 3 refills | Status: DC

## 2020-11-09 MED ORDER — ALPRAZOLAM 0.5 MG PO TABS: 0.2500 mg | ORAL_TABLET | Freq: Every evening | ORAL | 0 refills | Status: AC | PRN

## 2020-11-09 MED ORDER — TOPIRAMATE 50 MG PO TABS: 50.0000 mg | ORAL_TABLET | Freq: Two times a day (BID) | ORAL | 3 refills | Status: DC

## 2020-11-09 NOTE — Assessment & Plan Note (Signed)
Migraines are well controlled with topiramate and an occasional Maxalt.

## 2020-11-09 NOTE — Assessment & Plan Note (Signed)
Cloa does have generalized anxiety with occasional panic attacks. No definitive association with her cycles. She was doing well several years ago on Trintellix, she did fail citalopram and Abilify. Restarting Trintellix to 10 mg daily. Because she does have occasional episodes of panic I am also going to add alprazolam 0.5 mg to use as needed, number 10/month.

## 2020-11-09 NOTE — Assessment & Plan Note (Signed)
Annual physical as above, adding routine labs, flu shot given today. She is up-to-date on her Covid vaccinations but does still need the booster. She is also due for cervical cancer screening, referral to Dr. Lyn Hollingshead for this. We will probably give her the Tdap at the follow-up visit.

## 2020-11-09 NOTE — Progress Notes (Signed)
Subjective:    CC: Annual Physical Exam  HPI:  This patient is here for their annual physical  I reviewed the past medical history, family history, social history, surgical history, and allergies today and no changes were needed.  Please see the problem list section below in epic for further details.  Past Medical History: No past medical history on file. Past Surgical History: No past surgical history on file. Social History: Social History   Socioeconomic History  . Marital status: Single    Spouse name: Not on file  . Number of children: Not on file  . Years of education: Not on file  . Highest education level: Not on file  Occupational History  . Not on file  Tobacco Use  . Smoking status: Never Smoker  . Smokeless tobacco: Never Used  Substance and Sexual Activity  . Alcohol use: Not on file  . Drug use: Not on file  . Sexual activity: Not on file  Other Topics Concern  . Not on file  Social History Narrative  . Not on file   Social Determinants of Health   Financial Resource Strain: Not on file  Food Insecurity: Not on file  Transportation Needs: Not on file  Physical Activity: Not on file  Stress: Not on file  Social Connections: Not on file   Family History: No family history on file. Allergies: No Known Allergies Medications: See med rec.  Review of Systems: No headache, visual changes, nausea, vomiting, diarrhea, constipation, dizziness, abdominal pain, skin rash, fevers, chills, night sweats, swollen lymph nodes, weight loss, chest pain, body aches, joint swelling, muscle aches, shortness of breath, mood changes, visual or auditory hallucinations.  Objective:    General: Well Developed, well nourished, and in no acute distress.  Neuro: Alert and oriented x3, extra-ocular muscles intact, sensation grossly intact. Cranial nerves II through XII are intact, motor, sensory, and coordinative functions are all intact. HEENT: Normocephalic, atraumatic,  pupils equal round reactive to light, neck supple, no masses, no lymphadenopathy, thyroid nonpalpable. Oropharynx, nasopharynx, external ear canals are unremarkable. Skin: Warm and dry, no rashes noted.  Cardiac: Regular rate and rhythm, no murmurs rubs or gallops.  Respiratory: Clear to auscultation bilaterally. Not using accessory muscles, speaking in full sentences.  Abdominal: Soft, nontender, nondistended, positive bowel sounds, no masses, no organomegaly.  Musculoskeletal: Shoulder, elbow, wrist, hip, knee, ankle stable, and with full range of motion.  Impression and Recommendations:    The patient was counselled, risk factors were discussed, anticipatory guidance given.  Annual physical exam Annual physical as above, adding routine labs, flu shot given today. She is up-to-date on her Covid vaccinations but does still need the booster. She is also due for cervical cancer screening, referral to Dr. Lyn Hollingshead for this. We will probably give her the Tdap at the follow-up visit.  Generalized anxiety disorder Kayann does have generalized anxiety with occasional panic attacks. No definitive association with her cycles. She was doing well several years ago on Trintellix, she did fail citalopram and Abilify. Restarting Trintellix to 10 mg daily. Because she does have occasional episodes of panic I am also going to add alprazolam 0.5 mg to use as needed, number 10/month.  Migraine headache with aura Migraines are well controlled with topiramate and an occasional Maxalt.   RBC microcytosis RBC microcytosis in the absence of anemia in a 25 year old female is likely due to early iron deficiency, she will supplement iron sulfate 1-2 times daily.   ___________________________________________ Ihor Austin. Benjamin Stain, M.D., ABFM.,  CAQSM. Primary Care and Sports Medicine [facility]  Adjunct Professor of Family Medicine  [employer]  [employer]

## 2020-11-10 DIAGNOSIS — R718 Other abnormality of red blood cells: Secondary | ICD-10-CM | POA: Insufficient documentation

## 2020-11-10 MED ORDER — VITAMIN D (ERGOCALCIFEROL) 1.25 MG (50000 UNIT) PO CAPS: 50000.0000 [IU] | ORAL_CAPSULE | ORAL | 0 refills | Status: DC

## 2020-11-10 MED ORDER — FERROUS SULFATE 325 (65 FE) MG PO TBEC: 325.0000 mg | DELAYED_RELEASE_TABLET | Freq: Every day | ORAL | 11 refills | Status: AC

## 2020-11-10 NOTE — Assessment & Plan Note (Signed)
RBC microcytosis in the absence of anemia in a 25 year old female is likely due to early iron deficiency, she will supplement iron sulfate 1-2 times daily.

## 2020-11-10 NOTE — Addendum Note (Signed)
Addended by: Monica Becton on: 11/10/2020 09:07 AM   Modules accepted: Orders

## 2020-12-21 ENCOUNTER — Ambulatory Visit: Payer: 59 | Admitting: Sports Medicine

## 2020-12-21 ENCOUNTER — Encounter: Payer: Self-pay | Admitting: Sports Medicine

## 2020-12-21 ENCOUNTER — Other Ambulatory Visit: Payer: Self-pay

## 2020-12-21 DIAGNOSIS — F411 Generalized anxiety disorder: Secondary | ICD-10-CM | POA: Diagnosis not present

## 2020-12-21 MED ORDER — VORTIOXETINE HBR 20 MG PO TABS: 20.0000 mg | ORAL_TABLET | Freq: Every day | ORAL | 11 refills | Status: DC

## 2020-12-21 NOTE — Assessment & Plan Note (Signed)
Patricia Best returns, she is doing a lot better on Trintellix 10, she is only had to use a few of her alprazolam. Increasing to 20 mg daily. Return as needed.

## 2020-12-21 NOTE — Progress Notes (Signed)
    Procedures performed today:    None.  Independent interpretation of notes and tests performed by another provider:   None.  Brief History, Exam, Impression, and Recommendations:    Generalized anxiety disorder Patricia Best returns, she is doing a lot better on Trintellix 10, she is only had to use a few of her alprazolam. Increasing to 20 mg daily. Return as needed.    ___________________________________________ Patricia Best. Patricia Best, M.D., ABFM., CAQSM. Primary Care and Sports Medicine [facility]  Adjunct Instructor of Family Medicine  [employer]

## 2020-12-21 NOTE — Telephone Encounter (Signed)
Call from [facility] stating the Fioricet would make patient's Tamoxifen 60-86% ineffective. They asked to change this to another medication.

## 2020-12-21 NOTE — Telephone Encounter (Signed)
This encounter was created in error - please disregard.

## 2020-12-27 ENCOUNTER — Other Ambulatory Visit: Payer: Self-pay

## 2020-12-27 NOTE — Progress Notes (Signed)
  Subjective:    CC: cervical cancer screening  HPI: Pleasant 25 year old female presenting for completion of her first pap smear.   Menstrual cycles: Having regular menstrual cycles that last for approximately 7 days with 4 days of heavy flow. Sexually active: Prefers female partners, not currently sexually active and has not been in 1 year. Birth control: Not currently using birth control. Risk for STIs: Low risk for STIs, no concerns today.  I reviewed the past medical history, family history, social history, surgical history, and allergies today and no changes were needed.  Please see the problem list section below in epic for further details.  Past Medical History: No past medical history on file. Past Surgical History: No past surgical history on file. Social History: Social History   Socioeconomic History  . Marital status: Single    Spouse name: Not on file  . Number of children: Not on file  . Years of education: Not on file  . Highest education level: Not on file  Occupational History  . Not on file  Tobacco Use  . Smoking status: Never Smoker  . Smokeless tobacco: Never Used  Substance and Sexual Activity  . Alcohol use: Not on file  . Drug use: Not on file  . Sexual activity: Not on file  Other Topics Concern  . Not on file  Social History Narrative  . Not on file   Social Determinants of Health   Financial Resource Strain: Not on file  Food Insecurity: Not on file  Transportation Needs: Not on file  Physical Activity: Not on file  Stress: Not on file  Social Connections: Not on file   Family History: No family history on file. Allergies: No Known Allergies Medications: See med rec.  Review of Systems: See HPI for pertinent positives and negatives.   Objective:    General: Well Developed, well nourished, and in no acute distress.  Neuro: Alert and oriented x3.  HEENT: Normocephalic, atraumatic.  Skin: Warm and dry. Cardiac: Regular rate and  rhythm.  Respiratory: Not using accessory muscles, speaking in full sentences. Pelvic exam: normal external genitalia, vulva, vagina, cervix, uterus and adnexa.  Pap smear completed today.  Chaperoned by Gonzella Lex, MA.  Impression and Recommendations:    1. Screening for cervical cancer Pap smear completed without difficulty. Patient tolerated well.  - Cytology - PAP  Return if symptoms worsen or fail to improve. ___________________________________________ Thayer Ohm, DNP, APRN, FNP-BC Primary Care and Sports Medicine [facility]

## 2020-12-28 ENCOUNTER — Encounter: Payer: Self-pay | Admitting: Medical-Surgical

## 2020-12-28 ENCOUNTER — Ambulatory Visit: Payer: 59 | Admitting: Medical-Surgical

## 2020-12-28 ENCOUNTER — Other Ambulatory Visit (HOSPITAL_COMMUNITY)
Admission: RE | Admit: 2020-12-28 | Discharge: 2020-12-28 | Disposition: A | Discharge status: Home / Self Care | Payer: 59 | Source: Ambulatory Visit | Attending: Medical-Surgical | Admitting: Medical-Surgical

## 2020-12-28 ENCOUNTER — Other Ambulatory Visit: Payer: Self-pay

## 2020-12-28 VITALS — BP 107/76 | HR 82 | Temp 98.1°F | Resp 20 | Ht 61.0 in | Wt 120.0 lb

## 2020-12-28 DIAGNOSIS — R87612 Low grade squamous intraepithelial lesion on cytologic smear of cervix (LGSIL): Secondary | ICD-10-CM

## 2020-12-28 DIAGNOSIS — Z124 Encounter for screening for malignant neoplasm of cervix: Secondary | ICD-10-CM

## 2020-12-30 LAB — CYTOLOGY - PAP

## 2021-01-02 ENCOUNTER — Other Ambulatory Visit: Payer: Self-pay

## 2021-01-02 DIAGNOSIS — R87612 Low grade squamous intraepithelial lesion on cytologic smear of cervix (LGSIL): Secondary | ICD-10-CM

## 2021-01-02 NOTE — Addendum Note (Signed)
Addended byChristen Butter on: 01/02/2021 11:58 AM   Modules accepted: Orders

## 2021-01-07 ENCOUNTER — Other Ambulatory Visit: Payer: Self-pay | Admitting: Sports Medicine

## 2021-01-07 DIAGNOSIS — R718 Other abnormality of red blood cells: Secondary | ICD-10-CM

## 2021-01-23 ENCOUNTER — Encounter: Payer: 59 | Admitting: Obstetrics and Gynecology

## 2021-01-23 ENCOUNTER — Telehealth: Payer: Self-pay

## 2021-01-23 NOTE — Telephone Encounter (Signed)
Spoke with pt about today's appt. Per Dr Earlene Plater pt needs repeat pap smear in one year. Pt was informed of recommendations and given the option to come in for a discussion or if she just wanted to return in one year. Pt declined appt for discussion and will return to [place] or PCP in one year for pap smear. Appt cancelled.

## 2021-03-08 ENCOUNTER — Other Ambulatory Visit: Payer: Self-pay | Admitting: Sports Medicine

## 2021-03-08 DIAGNOSIS — F411 Generalized anxiety disorder: Secondary | ICD-10-CM

## 2021-07-06 LAB — OB RESULTS CONSOLE GC/CHLAMYDIA: Chlamydia: NEGATIVE

## 2021-08-11 ENCOUNTER — Other Ambulatory Visit: Payer: Self-pay

## 2021-08-11 ENCOUNTER — Ambulatory Visit: Payer: 59 | Admitting: Family Medicine

## 2021-08-11 ENCOUNTER — Encounter: Payer: Self-pay | Admitting: Family Medicine

## 2021-08-11 VITALS — BP 124/87 | HR 83 | Temp 98.1°F | Wt 120.0 lb

## 2021-08-11 DIAGNOSIS — R112 Nausea with vomiting, unspecified: Secondary | ICD-10-CM

## 2021-08-11 LAB — POCT INFLUENZA A/B

## 2021-08-11 LAB — POCT URINE PREGNANCY: Preg Test, Ur: NEGATIVE

## 2021-08-11 MED ORDER — ONDANSETRON 4 MG PO TBDP: 4.0000 mg | ORAL_TABLET | Freq: Three times a day (TID) | ORAL | 0 refills | Status: DC | PRN

## 2021-08-11 NOTE — Patient Instructions (Addendum)
Pregnancy test = negative Flu test = negative COVID test sent out - we will let you know when results are back. Quarantine in the meantime. Zofran sent in for nausea Clear liquid diet until symptoms improve then progress as tolerated

## 2021-08-11 NOTE — Progress Notes (Signed)
Acute Office Visit  Subjective:    Patient ID: Patricia Best, female    DOB: 1996/10/13, 25 y.o.   MRN: [id number]  Chief Complaint  Patient presents with   Nausea    HPI Patient is in today for nausea and vomiting.   Patient states she started feeling nauseous on Tuesday with a decreased appetite and lower energy levels. Yesterday she vomiting 5 times and had some generalized abdominal discomfort that has now resolved. She remains nauseous with poor appetite, but also developed a sore throat last night. She denies eating anything out of the ordinary for her recently, no blood in emesis, fevers, headaches, cough, sinus congestion, rhinorrhea, chest pain, shortness of breath, diarrhea, ongoing abdominal pain.   She had an IUD place about a month ago. States she has had ongoing spotting since then, but thinks she may be having an actual period since this Monday. She had a negative pregnancy test at home yesterday. She has not tried feeling for her IUD strings, but states she had them checked about 3 weeks ago. She is sexually acitve, denies concerns for any STDs.     No past medical history on file.  No past surgical history on file.  No family history on file.  Social History   Socioeconomic History   Marital status: Single    Spouse name: Not on file   Number of children: Not on file   Years of education: Not on file   Highest education level: Not on file  Occupational History   Not on file  Tobacco Use   Smoking status: Never   Smokeless tobacco: Never  Substance and Sexual Activity   Alcohol use: Not on file   Drug use: Not on file   Sexual activity: Not on file  Other Topics Concern   Not on file  Social History Narrative   Not on file   Social Determinants of Health   Financial Resource Strain: Not on file  Food Insecurity: Not on file  Transportation Needs: Not on file  Physical Activity: Not on file  Stress: Not on file  Social Connections: Not on file   Intimate Partner Violence: Not on file    Outpatient Medications Prior to Visit  Medication Sig Dispense Refill   ALPRAZolam (XANAX) 0.5 MG tablet Take 0.5-1 tablets (0.25-0.5 mg total) by mouth at bedtime as needed for anxiety. 10 tablet 0   ferrous sulfate 325 (65 FE) MG EC tablet Take 1 tablet (325 mg total) by mouth daily with breakfast. 90 tablet 11   rizatriptan (MAXALT) 10 MG tablet MAY REPEAT IN 2 HOURS IF NEEDED 4 tablet 29   topiramate (TOPAMAX) 50 MG tablet Take 1 tablet (50 mg total) by mouth 2 (two) times daily. 180 tablet 3   Vitamin D, Ergocalciferol, (DRISDOL) 1.25 MG (50000 UNIT) CAPS capsule Take 1 capsule (50,000 Units total) by mouth every 7 (seven) days. Take for 8 total doses(weeks) 8 capsule 0   vortioxetine HBr (TRINTELLIX) 20 MG TABS tablet Take 1 tablet (20 mg total) by mouth daily. 90 tablet 3   No facility-administered medications prior to visit.    No Known Allergies  Review of Systems All review of systems negative except what is listed in the HPI     Objective:    Physical Exam Vitals reviewed.  Constitutional:      Appearance: Normal appearance.  HENT:     Head: Normocephalic and atraumatic.  Cardiovascular:     Rate and Rhythm: Normal  rate and regular rhythm.     Heart sounds: Normal heart sounds.  Pulmonary:     Effort: Pulmonary effort is normal.     Breath sounds: Normal breath sounds.  Abdominal:     General: Abdomen is flat. Bowel sounds are normal. There is no distension.     Palpations: Abdomen is soft. There is no mass.     Tenderness: There is no abdominal tenderness. There is no guarding or rebound.     Hernia: No hernia is present.  Musculoskeletal:     Cervical back: Normal range of motion and neck supple.  Skin:    General: Skin is warm and dry.  Neurological:     Mental Status: She is alert and oriented to person, place, and time.  Psychiatric:        Mood and Affect: Mood normal.        Behavior: Behavior normal.         Thought Content: Thought content normal.        Judgment: Judgment normal.    There were no vitals taken for this visit. Wt Readings from Last 3 Encounters:  12/28/20 120 lb (54.4 kg)  12/21/20 122 lb (55.3 kg)  11/09/20 124 lb (56.2 kg)    Health Maintenance Due  Topic Date Due   HIV Screening  Never done   Hepatitis C Screening  Never done   TETANUS/TDAP  03/14/2020   INFLUENZA VACCINE  06/05/2021    There are no preventive care reminders to display for this patient.   Lab Results  Component Value Date   TSH 1.28 11/09/2020   Lab Results  Component Value Date   WBC 8.6 11/09/2020   HGB 12.3 11/09/2020   HCT 38.4 11/09/2020   MCV 76.3 (L) 11/09/2020   PLT 373 11/09/2020   Lab Results  Component Value Date   NA 141 11/09/2020   K 4.7 11/09/2020   CO2 25 11/09/2020   GLUCOSE 85 11/09/2020   BUN 8 11/09/2020   CREATININE 0.77 11/09/2020   BILITOT 0.4 11/09/2020   ALKPHOS 67 04/09/2017   AST 14 11/09/2020   ALT 8 11/09/2020   PROT 7.7 11/09/2020   ALBUMIN 4.4 04/09/2017   CALCIUM 9.8 11/09/2020   Lab Results  Component Value Date   CHOL 170 11/09/2020   Lab Results  Component Value Date   HDL 55 11/09/2020   Lab Results  Component Value Date   LDLCALC 99 11/09/2020   Lab Results  Component Value Date   TRIG 72 11/09/2020   Lab Results  Component Value Date   CHOLHDL 3.1 11/09/2020   Lab Results  Component Value Date   HGBA1C 5.3 04/09/2017       Assessment & Plan:   1. Nausea and vomiting, unspecified vomiting type -POCT urine pregnancy = negative in office today -COVID test sent off, will let her know when results are back. -Flu test = negative -Giving her some ODT Zofran for nausea -Recommend liquid diet with adequate hydration until symptoms improve, then progress as tolerated -Patient aware of signs/symptoms requiring further/urgent evaluation.  Follow-up if symptoms worsen or fail to improve.    Clayborne Dana, NP

## 2021-08-12 LAB — NOVEL CORONAVIRUS, NAA: SARS-CoV-2, NAA: NOT DETECTED

## 2021-08-12 LAB — SARS-COV-2, NAA 2 DAY TAT

## 2021-11-04 ENCOUNTER — Other Ambulatory Visit: Payer: Self-pay | Admitting: Sports Medicine

## 2021-11-04 DIAGNOSIS — G43109 Migraine with aura, not intractable, without status migrainosus: Secondary | ICD-10-CM

## 2022-01-01 ENCOUNTER — Other Ambulatory Visit: Payer: Self-pay | Admitting: Sports Medicine

## 2022-01-01 DIAGNOSIS — G43109 Migraine with aura, not intractable, without status migrainosus: Secondary | ICD-10-CM

## 2022-01-28 ENCOUNTER — Other Ambulatory Visit: Payer: Self-pay | Admitting: Sports Medicine

## 2022-01-28 DIAGNOSIS — G43109 Migraine with aura, not intractable, without status migrainosus: Secondary | ICD-10-CM

## 2022-01-30 ENCOUNTER — Telehealth (INDEPENDENT_AMBULATORY_CARE_PROVIDER_SITE_OTHER): Payer: 59 | Admitting: Sports Medicine

## 2022-01-30 DIAGNOSIS — G43109 Migraine with aura, not intractable, without status migrainosus: Secondary | ICD-10-CM | POA: Diagnosis not present

## 2022-01-30 MED ORDER — TOPIRAMATE 50 MG PO TABS: 50.0000 mg | ORAL_TABLET | Freq: Two times a day (BID) | ORAL | 3 refills | Status: AC

## 2022-01-30 NOTE — Assessment & Plan Note (Signed)
Pleasant 26 year old female, long history of migraines, well controlled with Topamax, refilling medication. ?She does have a physical coming up in June. ?

## 2022-01-30 NOTE — Progress Notes (Signed)
? ?  Virtual Visit via Telephone ?  ?I connected with  Patricia Best  on 01/30/22 by telephone/telehealth and verified that I am speaking with the correct person using two identifiers. ?  ?I discussed the limitations, risks, security and privacy concerns of performing an evaluation and management service by telephone, including the higher likelihood of inaccurate diagnosis and treatment, and the availability of in person appointments.  We also discussed the likely need of an additional face to face encounter for complete and high quality delivery of care.  I also discussed with the patient that there may be a patient responsible charge related to this service. The patient expressed understanding and wishes to proceed. ? ?Provider location is in medical facility. ?Patient location is at their home, different from provider location. ?People involved in care of the patient during this telehealth encounter were myself, my nurse/medical assistant, and my front office/scheduling team member. ? ?Review of Systems: No fevers, chills, night sweats, weight loss, chest pain, or shortness of breath.  ? ?Objective Findings:   ? ?General: Speaking full sentences, no audible heavy breathing.  Sounds alert and appropriately interactive.   ? ?Independent interpretation of tests performed by another provider:  ? ?None. ? ?Brief History, Exam, Impression, and Recommendations:   ? ?Migraine headache with aura ?Pleasant 26 year old female, long history of migraines, well controlled with Topamax, refilling medication. ?She does have a physical coming up in June. ? ?I discussed the above assessment and treatment plan with the patient. The patient was provided an opportunity to ask questions and all were answered. The patient agreed with the plan and demonstrated an understanding of the instructions. ?  ?The patient was advised to call back or seek an in-person evaluation if the symptoms worsen or if the condition fails to improve as  anticipated. ?  ?I provided 30 minutes of verbal and non-verbal time during this encounter date, time was needed to gather information, review chart, records, communicate/coordinate with staff remotely, as well as complete documentation. ? ? ?___________________________________________ ?Ihor Austin. Benjamin Stain, M.D., ABFM., CAQSM. ?Primary Care and Sports Medicine ?[facility] Patricia Best ? ?Adjunct Professor of Family Medicine  ?[employer] ? ?

## 2022-01-30 NOTE — Progress Notes (Signed)
Patricia Best would like a phone call for visit today at [phone]. She is requesting a refill on Topamax as she takes this for migraines. She states that she has about 4 migraines a month on Topamax. If she does not take it, she has around 15 migraines per month.  ?

## 2022-02-21 ENCOUNTER — Other Ambulatory Visit: Payer: Self-pay | Admitting: Sports Medicine

## 2022-02-21 DIAGNOSIS — F411 Generalized anxiety disorder: Secondary | ICD-10-CM

## 2022-03-19 ENCOUNTER — Other Ambulatory Visit: Payer: Self-pay | Admitting: Sports Medicine

## 2022-03-19 DIAGNOSIS — F411 Generalized anxiety disorder: Secondary | ICD-10-CM

## 2022-04-18 ENCOUNTER — Other Ambulatory Visit: Payer: Self-pay | Admitting: Sports Medicine

## 2022-04-18 DIAGNOSIS — F411 Generalized anxiety disorder: Secondary | ICD-10-CM

## 2022-05-15 ENCOUNTER — Ambulatory Visit (INDEPENDENT_AMBULATORY_CARE_PROVIDER_SITE_OTHER): Payer: 59 | Admitting: Sports Medicine

## 2022-05-15 VITALS — BP 114/78 | HR 85 | Ht 61.0 in | Wt 117.0 lb

## 2022-05-15 DIAGNOSIS — E782 Mixed hyperlipidemia: Secondary | ICD-10-CM

## 2022-05-15 DIAGNOSIS — Z23 Encounter for immunization: Secondary | ICD-10-CM | POA: Diagnosis not present

## 2022-05-15 DIAGNOSIS — Z0000 Encounter for general adult medical examination without abnormal findings: Secondary | ICD-10-CM | POA: Diagnosis not present

## 2022-05-15 DIAGNOSIS — Z111 Encounter for screening for respiratory tuberculosis: Secondary | ICD-10-CM

## 2022-05-15 DIAGNOSIS — L659 Nonscarring hair loss, unspecified: Secondary | ICD-10-CM | POA: Diagnosis not present

## 2022-05-15 MED ORDER — MINOXIDIL 5 % EX FOAM: 1.0000 | Freq: Two times a day (BID) | CUTANEOUS | 11 refills | Status: AC

## 2022-05-15 NOTE — Assessment & Plan Note (Signed)
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 Needs some forms filled out for this. Happy, healthy, up-to-date on chlamydia screening done last year, Tdap given today. We will get routine labs today including QuantiFERON gold.

## 2022-05-15 NOTE — Addendum Note (Signed)
Addended by: Carolin Coy on: 05/15/2022 10:48 AM   Modules accepted: Orders

## 2022-05-15 NOTE — Progress Notes (Signed)
  Subjective:    CC: Annual Physical Exam  HPI:  This patient is here for their annual physical  I reviewed the past medical history, family history, social history, surgical history, and allergies today and no changes were needed.  Please see the problem list section below in epic for further details.  Past Medical History: No past medical history on file. Past Surgical History: No past surgical history on file. Social History: Social History   Socioeconomic History   Marital status: Single    Spouse name: Not on file   Number of children: Not on file   Years of education: Not on file   Highest education level: Not on file  Occupational History   Not on file  Tobacco Use   Smoking status: Never   Smokeless tobacco: Never  Substance and Sexual Activity   Alcohol use: Not on file   Drug use: Not on file   Sexual activity: Not on file  Other Topics Concern   Not on file  Social History Narrative   Not on file   Social Determinants of Health   Financial Resource Strain: Not on file  Food Insecurity: Not on file  Transportation Needs: Not on file  Physical Activity: Not on file  Stress: Not on file  Social Connections: Not on file   Family History: No family history on file. Allergies: No Known Allergies Medications: See med rec.  Review of Systems: No headache, visual changes, nausea, vomiting, diarrhea, constipation, dizziness, abdominal pain, skin rash, fevers, chills, night sweats, swollen lymph nodes, weight loss, chest pain, body aches, joint swelling, muscle aches, shortness of breath, mood changes, visual or auditory hallucinations.  Objective:    General: Well Developed, well nourished, and in no acute distress.  Neuro: Alert and oriented x3, extra-ocular muscles intact, sensation grossly intact. Cranial nerves II through XII are intact, motor, sensory, and coordinative functions are all intact. HEENT: Normocephalic, atraumatic, pupils equal round  reactive to light, neck supple, no masses, no lymphadenopathy, thyroid nonpalpable. Oropharynx, nasopharynx, external ear canals are unremarkable. Skin: Warm and dry, no rashes noted.  Cardiac: Regular rate and rhythm, no murmurs rubs or gallops.  Respiratory: Clear to auscultation bilaterally. Not using accessory muscles, speaking in full sentences.  Abdominal: Soft, nontender, nondistended, positive bowel sounds, no masses, no organomegaly.  Musculoskeletal: Shoulder, elbow, wrist, hip, knee, ankle stable, and with full range of motion.  Impression and Recommendations:    The patient was counselled, risk factors were discussed, anticipatory guidance given.  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 Needs some forms filled out for this. Happy, healthy, up-to-date on chlamydia screening done last year, Tdap given today. We will get routine labs today including QuantiFERON gold.  Hair loss Mild thinning of hair frontal. Negative hair pull test. No recent illnesses. Checking labs including TSH, I will also add topical minoxidil, patient understands the importance of using this consistently.   ____________________________________________ Ihor Austin. Benjamin Stain, M.D., ABFM., CAQSM., AME. Primary Care and Sports Medicine [facility]  Adjunct Professor of Family Medicine  [employer]  Restaurant manager, [employer]

## 2022-05-15 NOTE — Assessment & Plan Note (Signed)
Mild thinning of hair frontal. Negative hair pull test. No recent illnesses. Checking labs including TSH, I will also add topical minoxidil, patient understands the importance of using this consistently.

## 2022-05-18 LAB — CBC
HCT: 43.3 % (ref 35.0–45.0)
Hemoglobin: 14.8 g/dL (ref 11.7–15.5)
MCH: 31.6 pg (ref 27.0–33.0)
MCHC: 34.2 g/dL (ref 32.0–36.0)
MCV: 92.5 fL (ref 80.0–100.0)
MPV: 9.8 fL (ref 7.5–12.5)
Platelets: 317 10*3/uL (ref 140–400)
RBC: 4.68 10*6/uL (ref 3.80–5.10)
RDW: 11.6 % (ref 11.0–15.0)
WBC: 5.9 10*3/uL (ref 3.8–10.8)

## 2022-05-18 LAB — COMPLETE METABOLIC PANEL WITH GFR
AG Ratio: 1.9 (calc) (ref 1.0–2.5)
ALT: 8 U/L (ref 6–29)
AST: 14 U/L (ref 10–30)
Albumin: 4.9 g/dL (ref 3.6–5.1)
Alkaline phosphatase (APISO): 47 U/L (ref 31–125)
BUN: 14 mg/dL (ref 7–25)
CO2: 25 mmol/L (ref 20–32)
Calcium: 10 mg/dL (ref 8.6–10.2)
Chloride: 105 mmol/L (ref 98–110)
Creat: 0.85 mg/dL (ref 0.50–0.96)
Globulin: 2.6 g/dL (calc) (ref 1.9–3.7)
Glucose, Bld: 76 mg/dL (ref 65–99)
Potassium: 5 mmol/L (ref 3.5–5.3)
Sodium: 138 mmol/L (ref 135–146)
Total Bilirubin: 0.9 mg/dL (ref 0.2–1.2)
Total Protein: 7.5 g/dL (ref 6.1–8.1)
eGFR: 99 mL/min/{1.73_m2} (ref >=60)

## 2022-05-18 LAB — LIPID PANEL
Cholesterol: 174 mg/dL (ref <200)
HDL: 53 mg/dL (ref >=50)
LDL Cholesterol (Calc): 102 mg/dL (calc) — ABNORMAL HIGH
Non-HDL Cholesterol (Calc): 121 mg/dL (calc) (ref <130)
Total CHOL/HDL Ratio: 3.3 (calc) (ref <5.0)
Triglycerides: 91 mg/dL (ref <150)

## 2022-05-18 LAB — QUANTIFERON-TB GOLD PLUS
Mitogen-NIL: 8.36 IU/mL
NIL: 0.01 IU/mL
QuantiFERON-TB Gold Plus: NEGATIVE
TB1-NIL: 0 IU/mL
TB2-NIL: 0 IU/mL

## 2022-05-18 LAB — TSH: TSH: 1.35 mIU/L

## 2023-02-18 ENCOUNTER — Encounter: Payer: Self-pay | Admitting: Medical-Surgical

## 2023-02-18 ENCOUNTER — Ambulatory Visit: Payer: 59 | Admitting: Medical-Surgical

## 2023-02-18 VITALS — BP 109/76 | HR 96 | Temp 99.6°F | Ht 61.0 in | Wt 124.8 lb

## 2023-02-18 DIAGNOSIS — J029 Acute pharyngitis, unspecified: Secondary | ICD-10-CM | POA: Diagnosis not present

## 2023-02-18 LAB — POCT RAPID STREP A (OFFICE): Rapid Strep A Screen: NEGATIVE

## 2023-02-18 NOTE — Patient Instructions (Signed)
Medications & Home Remedies for Upper Respiratory Illness   Note: the following list assumes no pregnancy, normal liver & kidney function and no other drug interactions. Dr. Alexander has highlighted medications which are safe for you to use, but these may not be appropriate for everyone. Always ask a pharmacist or qualified medical provider if you have any questions!    Aches/Pains, Fever, Headache OTC Acetaminophen (Tylenol) 500 mg tablets - take max 2 tablets (1000 mg) every 6 hours (4 times per day)  OTC Ibuprofen (Motrin) 200 mg tablets - take max 4 tablets (800 mg) every 6 hours*   Sinus Congestion Prescription Atrovent as directed OTC Nasal Saline if desired to rinse OTC Oxymetolazone (Afrin, others) sparing use due to rebound congestion, NEVER use in kids OTC Phenylephrine (Sudafed) 10 mg tablets every 4 hours (or the 12-hour formulation)* OTC Diphenhydramine (Benadryl) 25 mg tablets - take max 2 tablets every 4 hours   Cough & Sore Throat Prescription cough pills or syrups as directed OTC Dextromethorphan (Robitussin, others) - cough suppressant OTC Guaifenesin (Robitussin, Mucinex, others) - expectorant (helps cough up mucus) (Dextromethorphan and Guaifenesin also come in a combination tablet/syrup) OTC Lozenges w/ Benzocaine + Menthol (Cepacol) Honey - as much as you want! Teas which "coat the throat" - look for ingredients Elm Bark, Licorice Root, Marshmallow Root   Other Prescription Oral Steroids to decrease inflammation and improve energy Prescription Antibiotics if these are necessary for bacterial infection - take ALL, even if you're feeling better  OTC Zinc Lozenges within 24 hours of symptoms onset - mixed evidence this shortens the duration of the common cold Don't waste your money on Vitamin C or Echinacea in acute illness - it's already too late!    *Caution in patients with high blood pressure   

## 2023-02-18 NOTE — Progress Notes (Signed)
        Established patient visit  History, exam, impression, and plan:  1. Sore throat 2. Viral pharyngitis Pleasant 27 year old female presenting today with reports of sore throat that started approximately 3-4 days ago.  It originally started as mild but has progressed to moderate.  Has had a low-grade fever but no coughing, sneezing, ear pain/pressure, sinus congestion, rhinorrhea, or GI symptoms.  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  Using Tylenol as needed.  On exam, HRR, normal S1/S2, lungs CTA.  Mild posterior oropharyngeal erythema without exudate.  Otherwise normal HEENT assessment.  POCT strep negative.  Suspect viral pharyngitis.  Discussed conservative measures.  Offered Magic mouthwash, declined.  OTC medication list for viral symptoms provided with AVS.  If no benefit to these or if her symptoms continue to worsen/fail to improve, may consider doing a Medrol Dosepak.  She will let me know. - POCT rapid strep A  Procedures performed this visit: None.  Return if symptoms worsen or fail to improve.  __________________________________ Thayer Ohm, DNP, APRN, FNP-BC Primary Care and Sports Medicine [facility]

## 2023-02-19 ENCOUNTER — Ambulatory Visit (INDEPENDENT_AMBULATORY_CARE_PROVIDER_SITE_OTHER): Payer: 59 | Admitting: Medical-Surgical

## 2023-02-19 ENCOUNTER — Telehealth: Payer: Self-pay | Admitting: Medical-Surgical

## 2023-02-19 DIAGNOSIS — J029 Acute pharyngitis, unspecified: Secondary | ICD-10-CM

## 2023-02-19 LAB — POC COVID19 BINAXNOW: SARS Coronavirus 2 Ag: NEGATIVE

## 2023-02-19 LAB — POCT INFLUENZA A/B
Influenza A, POC: NEGATIVE
Influenza B, POC: NEGATIVE

## 2023-02-19 NOTE — Telephone Encounter (Signed)
Patient called this am stated feeling worse and wants to get tested for Flu and COVID

## 2023-02-19 NOTE — Progress Notes (Signed)
   Established Patient Office Visit  Subjective   Patient ID: Patricia Best, female    DOB: 1996/03/12  Age: 27 y.o. MRN: [id number]  Chief Complaint  Patient presents with   flu and covid testing     Nurse visit. - obtained while patient in car outside building.     HPI  Sore throat- nurse visit for covid and flu testing.   ROS    Objective:     There were no vitals taken for this visit.   Physical Exam   Results for orders placed or performed in visit on 02/19/23  POC COVID-19  Result Value Ref Range   SARS Coronavirus 2 Ag Negative Negative  POCT Influenza A/B  Result Value Ref Range   Influenza A, POC Negative Negative   Influenza B, POC Negative Negative      The ASCVD Risk score (Arnett DK, et al., 2019) failed to calculate for the following reasons:   The 2019 ASCVD risk score is only valid for ages 52 to 59    Assessment & Plan:  Covid and flu testing  nurse visit. =Covid and Flu both negative. Per Christen Butter, NP - likely viral. Will need to wait and watch.  If still not improving after 7 to 10 days then give [place] a call back to let us know. Patient declines offer of medrol dose pack by provider.  Problem List Items Addressed This Visit   None Visit Diagnoses     Sore throat    -  Primary   Relevant Orders   POC COVID-19 (Completed)   POCT Influenza A/B (Completed)       Return if symptoms worsen or fail to improve.    Elizabeth Palau, LPN

## 2023-02-19 NOTE — Progress Notes (Signed)
Agree with documentation as below.  ___________________________________________ Jennyfer Nickolson L. Amonda Brillhart, DNP, APRN, FNP-BC Primary Care and Sports Medicine [facility]  

## 2023-02-19 NOTE — Telephone Encounter (Signed)
Patient scheduled.

## 2023-02-19 NOTE — Telephone Encounter (Signed)
Please add to nurse visit schedule for drive up flu/covid swabs today.

## 2023-02-20 ENCOUNTER — Telehealth: Payer: Self-pay | Admitting: Sports Medicine

## 2023-02-20 NOTE — Telephone Encounter (Signed)
Please call in the following prescription to her pharmacy: Magic mouthwash compounded with -Diphenhydramine 125 mg / 5 mL-80 mL -Viscous lidocaine 2%-80 mL -Maalox-80 mL Dispense 240 mL Instructions: Swish, gargle, and spit 5 mL every 4 hours as needed for sore throat. Refills 0

## 2023-02-20 NOTE — Telephone Encounter (Signed)
Called in prescription     Patient advised

## 2023-02-20 NOTE — Telephone Encounter (Signed)
Pt called requesting a prescription for a mouth wash that was discussed in her last appointment with Christen Butter.

## 2023-04-08 ENCOUNTER — Telehealth: Payer: Self-pay | Admitting: General Practice

## 2023-04-08 NOTE — Transitions of Care (Post Inpatient/ED Visit) (Signed)
   04/08/2023  Name: Patricia Best MRN: [id number] DOB: 02/26/1996  Today's TOC FU Call Status: Today's TOC FU Call Status:: Successful TOC FU Call Competed TOC FU Call Complete Date: 04/08/23  Transition Care Management Follow-up Telephone Call Date of Discharge: 04/06/23 Discharge Facility: Other ([facility]) Name of Other ([facility]) [facility]charge Facility: [facility] Type of Discharge: Emergency Department Reason for ED Visit: Other: (Lip laceration) How have you been since you were released from the hospital?: Better Any questions or concerns?: No  Items Reviewed: Did you receive and understand the discharge instructions provided?: Yes Medications obtained,verified, and reconciled?: Yes (Medications Reviewed) Any new allergies since your discharge?: No Dietary orders reviewed?: NA Do you have support at home?: Yes  Medications Reviewed Today: Medications Reviewed Today     Reviewed by Modesto Charon, RN (Registered Nurse) on 04/08/23 at 1017  Med List Status: <None>   Medication Order Taking? Sig Documenting Provider Last Dose Status Informant  ALPRAZolam (XANAX) 0.5 MG tablet [id number] Yes Take 0.5-1 tablets (0.25-0.5 mg total) by mouth at bedtime as needed for anxiety. Monica Becton, MD Taking Active   amoxicillin-clavulanate (AUGMENTIN) 875-125 MG tablet [id number] Yes Take by mouth. [provider]  Active   ferrous sulfate 325 (65 FE) MG EC tablet [id number] Yes Take 1 tablet (325 mg total) by mouth daily with breakfast. Monica Becton, MD Taking Active   Minoxidil 5 % FOAM [id number] Yes Apply 1 Application topically 2 (two) times daily. Apply twice daily for up to 4 months Monica Becton, MD Taking Active   rizatriptan (MAXALT) 10 MG tablet [id number] Yes MAY REPEAT IN 2 HOURS IF NEEDED Monica Becton, MD Taking Active   topiramate (TOPAMAX) 50 MG tablet [id number] Yes Take 1 tablet (50 mg total) by mouth 2 (two) times daily. Monica Becton, MD Taking Active   TRINTELLIX 20 MG TABS tablet [id number] Yes TAKE 1 TABLET BY MOUTH EVERY DAY Monica Becton, MD Taking Active             Home Care and Equipment/Supplies: Were Home Health Services Ordered?: NA Any new equipment or medical supplies ordered?: NA  Functional Questionnaire: Do you need assistance with bathing/showering or dressing?: No Do you need assistance with meal preparation?: No Do you need assistance with eating?: No Do you have difficulty maintaining continence: No Do you need assistance with getting out of bed/getting out of a chair/moving?: No Do you have difficulty managing or taking your medications?: No  Follow up appointments reviewed: PCP Follow-up appointment confirmed?: NA Specialist Hospital Follow-up appointment confirmed?: NA Do you need transportation to your follow-up appointment?: No Do you understand care options if your condition(s) worsen?: Yes-patient verbalized understanding    SIGNATURE Modesto Charon, RN BSN

## 2024-07-09 ENCOUNTER — Encounter: Payer: Self-pay | Admitting: Sports Medicine
